# Patient Record
Sex: Female | Born: 1955 | Race: White | Hispanic: No | Smoking: Current every day smoker
Health system: Southern US, Community
[De-identification: ages and names within clinical notes are randomized; demographics above are authoritative.]

## PROBLEM LIST (undated history)

## (undated) DIAGNOSIS — I10 Essential (primary) hypertension: Secondary | ICD-10-CM

## (undated) DIAGNOSIS — K219 Gastro-esophageal reflux disease without esophagitis: Secondary | ICD-10-CM

## (undated) DIAGNOSIS — F32A Depression, unspecified: Secondary | ICD-10-CM

## (undated) DIAGNOSIS — F419 Anxiety disorder, unspecified: Secondary | ICD-10-CM

## (undated) DIAGNOSIS — N189 Chronic kidney disease, unspecified: Secondary | ICD-10-CM

## (undated) HISTORY — PX: APPENDECTOMY: SHX54

---

## 1999-07-16 ENCOUNTER — Encounter: Payer: Self-pay | Admitting: Family Medicine

## 1999-07-16 ENCOUNTER — Encounter: Admission: RE | Admit: 1999-07-16 | Discharge: 1999-07-16 | Payer: Self-pay | Admitting: Family Medicine

## 1999-12-31 ENCOUNTER — Other Ambulatory Visit: Admission: RE | Admit: 1999-12-31 | Discharge: 1999-12-31 | Payer: Self-pay | Admitting: *Deleted

## 2001-03-17 ENCOUNTER — Other Ambulatory Visit: Admission: RE | Admit: 2001-03-17 | Discharge: 2001-03-17 | Payer: Self-pay | Admitting: Family Medicine

## 2001-12-28 ENCOUNTER — Encounter: Admission: RE | Admit: 2001-12-28 | Discharge: 2001-12-28 | Payer: Self-pay | Admitting: Family Medicine

## 2001-12-28 ENCOUNTER — Encounter: Payer: Self-pay | Admitting: Family Medicine

## 2002-07-21 ENCOUNTER — Other Ambulatory Visit: Admission: RE | Admit: 2002-07-21 | Discharge: 2002-07-21 | Payer: Self-pay | Admitting: Family Medicine

## 2004-04-11 ENCOUNTER — Encounter: Admission: RE | Admit: 2004-04-11 | Discharge: 2004-04-11 | Payer: Self-pay | Admitting: Family Medicine

## 2004-05-22 ENCOUNTER — Other Ambulatory Visit: Admission: RE | Admit: 2004-05-22 | Discharge: 2004-05-22 | Payer: Self-pay | Admitting: Obstetrics and Gynecology

## 2004-05-31 ENCOUNTER — Encounter: Admission: RE | Admit: 2004-05-31 | Discharge: 2004-05-31 | Payer: Self-pay | Admitting: Obstetrics and Gynecology

## 2004-07-03 ENCOUNTER — Ambulatory Visit (HOSPITAL_COMMUNITY): Admission: RE | Admit: 2004-07-03 | Discharge: 2004-07-03 | Payer: Self-pay | Admitting: Obstetrics and Gynecology

## 2005-09-17 IMAGING — US US PELVIS COMPLETE MODIFY
1 series · 14 of 25 positions shown · non-contrast
Comparison: none

CLINICAL DATA: Irregular vaginal bleeding.
 ULTRASOUND OF THE PELVIS, COMPLETE, WITH TRANSVAGINAL, NON-OB
 Transabdominal and transvaginal imaging show overall normal uterine size and contour with measurements of 8.2 x 2.9 x 3.5 cm.  There are at least three fibroid lesions of the myometrium, all in the 2 cm size range.  In addition, however, there is an ovoid mass in or directly adjacent to the fundal aspect of the endometrium.  This measures 1.2 x 0.4 x 0.9 cm.  I cannot say with certainty if this is an endometrial polyp or a submucosal fibroid protruding into the endometrium.  However, it may well be the cause of the patient's vaginal bleeding.  A sonohysterogram would be of value, in that it could probably differentiate these two entities.  This can be done at the [HOSPITAL], if clinically warranted.
 Both ovaries are within normal limits in size and contour.  There are multiple follicles on the left. No free pelvic fluid. 
 IMPRESSION
 1.  Multiple fibroid lesions of the uterus.
 2.  Large endometrial polyp versus submucosal fibroid in the fundal region of the endometrium.  See report.

[Series 1: unknown · 0.23mm/px · 14 of 78 slices shown]
[im 1/78]
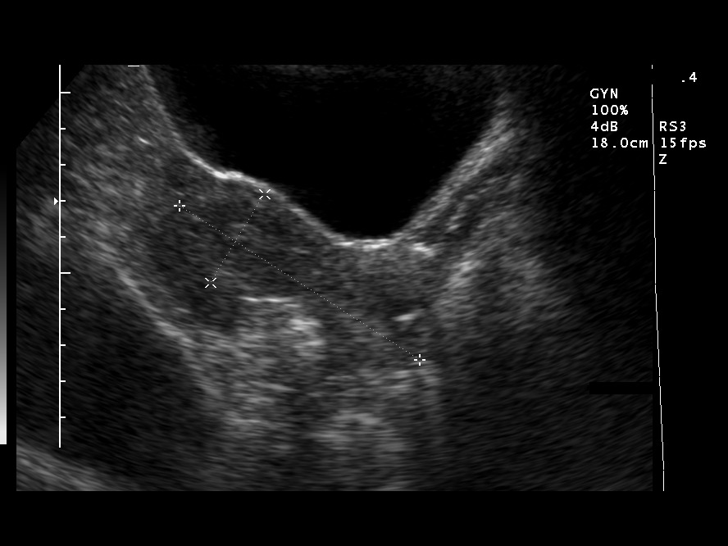
[im 7/78]
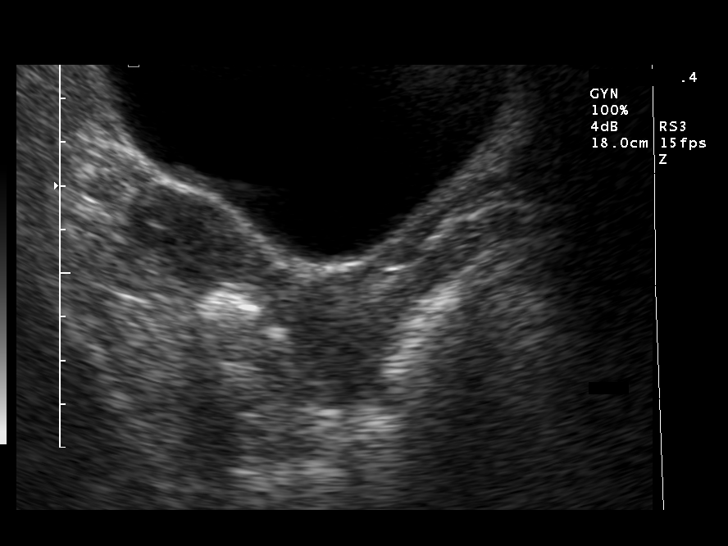
[im 13/78]
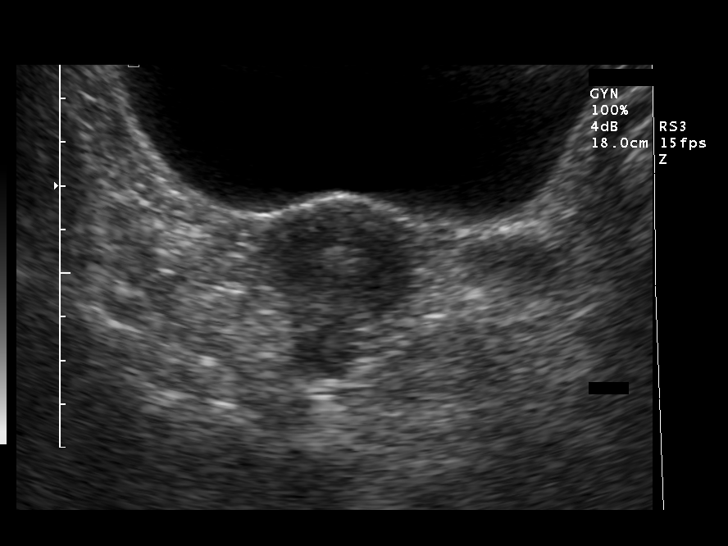
[im 20/78]
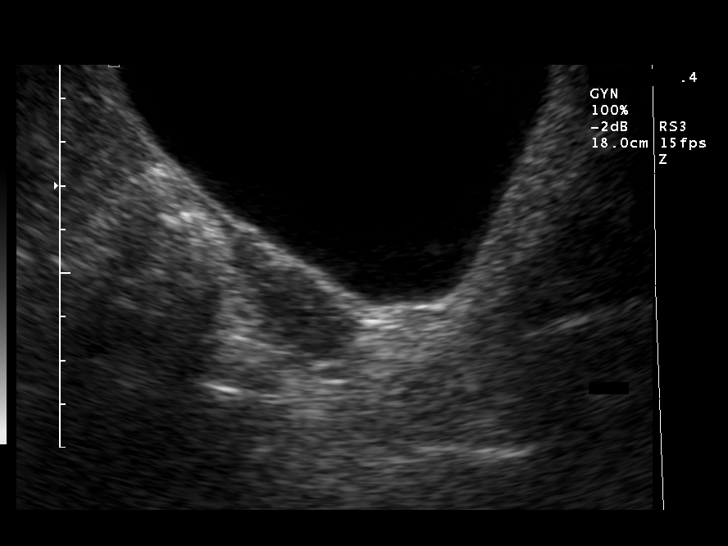
[im 26/78]
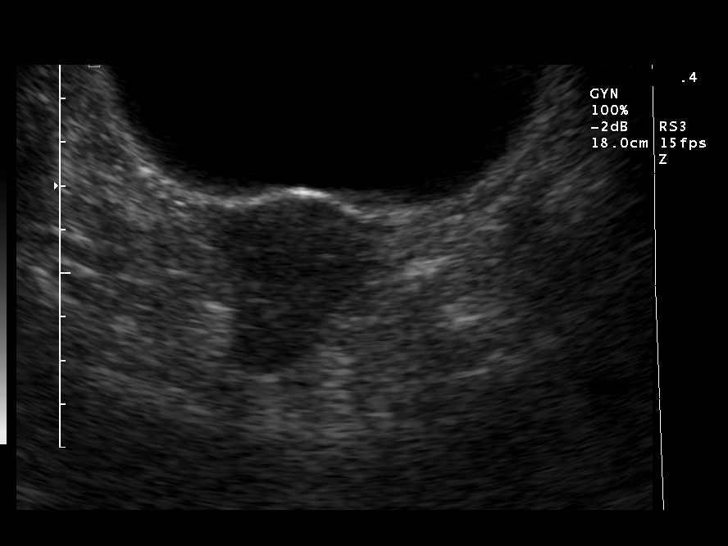
[im 29/78]
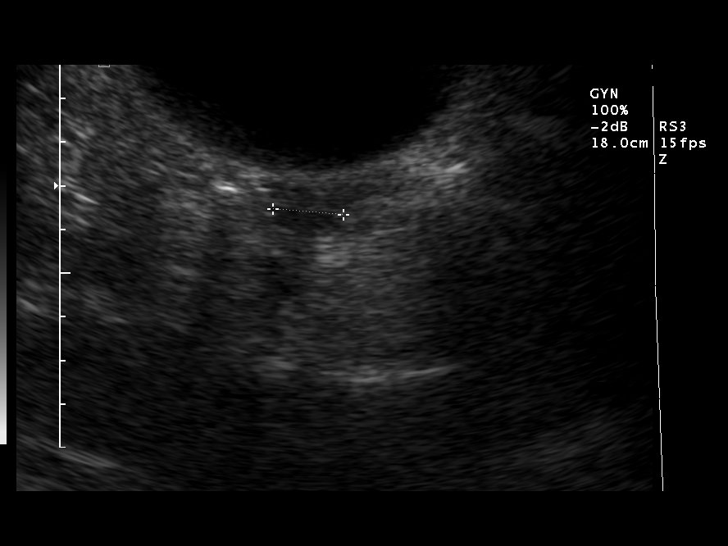
[im 36/78]
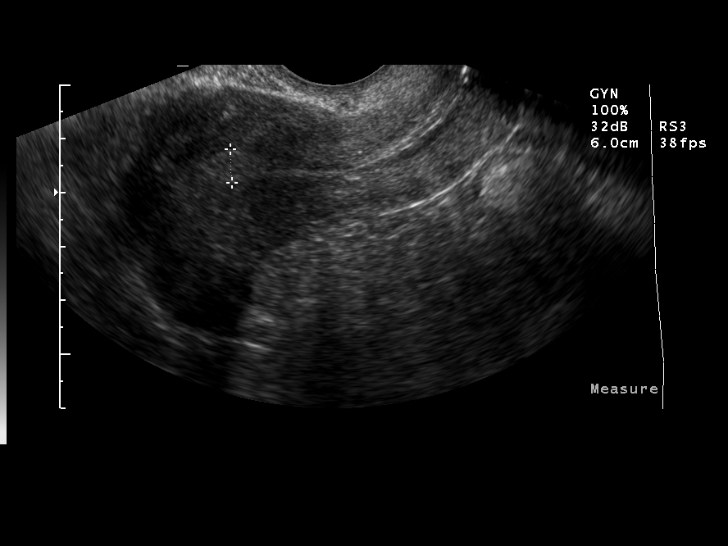
[im 42/78]
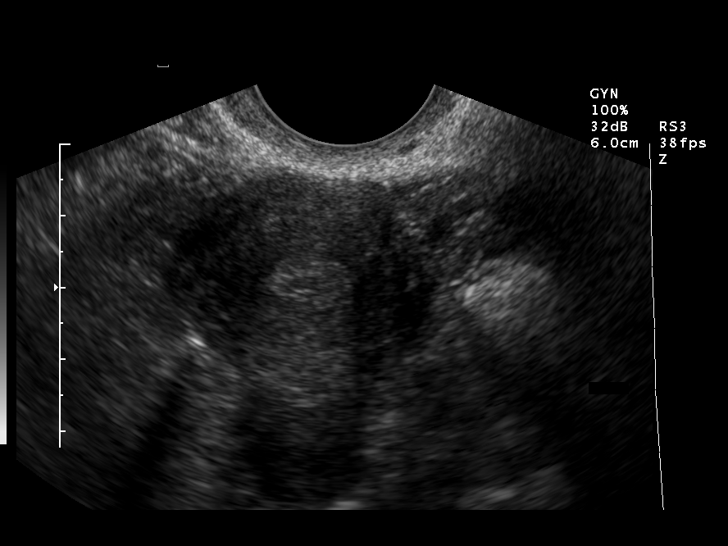
[im 49/78]
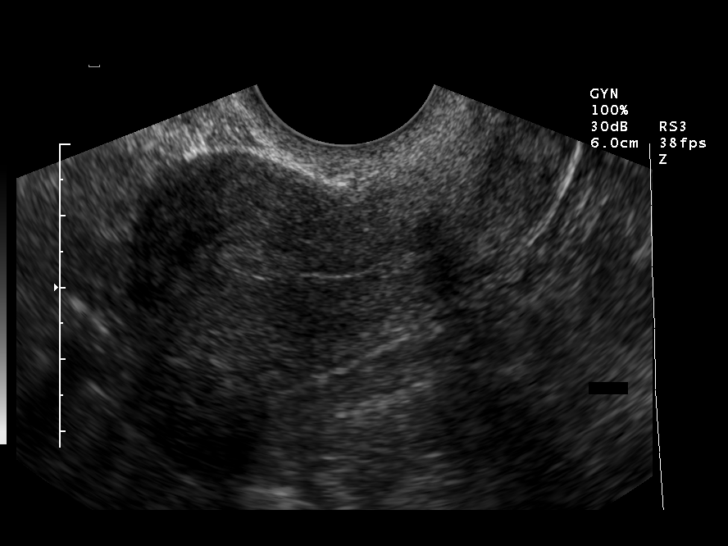
[im 52/78]
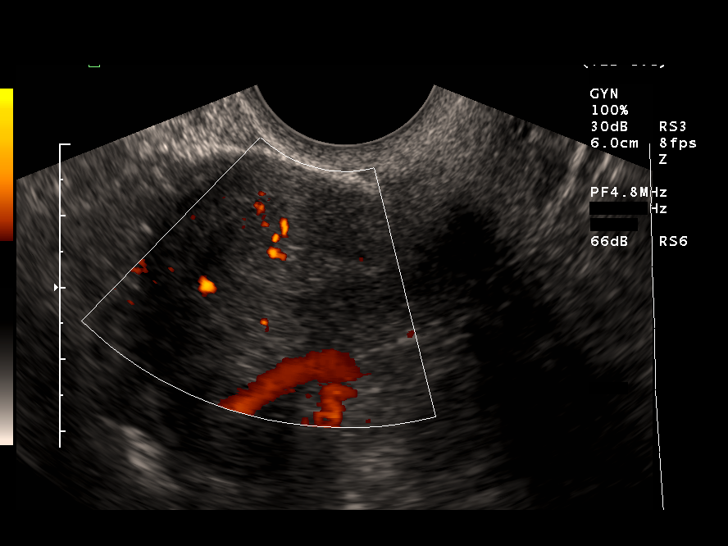
[im 58/78]
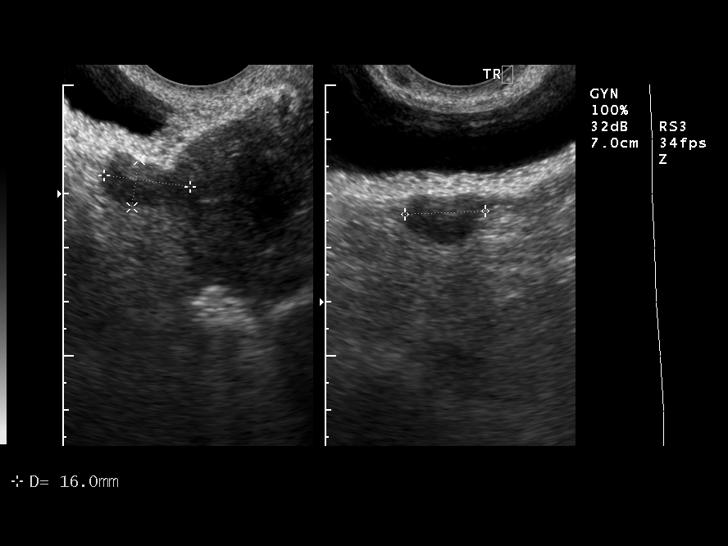
[im 65/78]
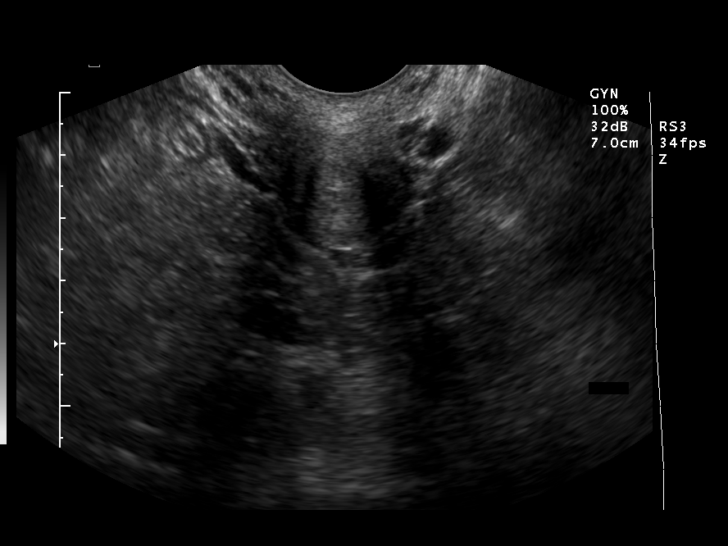
[im 71/78]
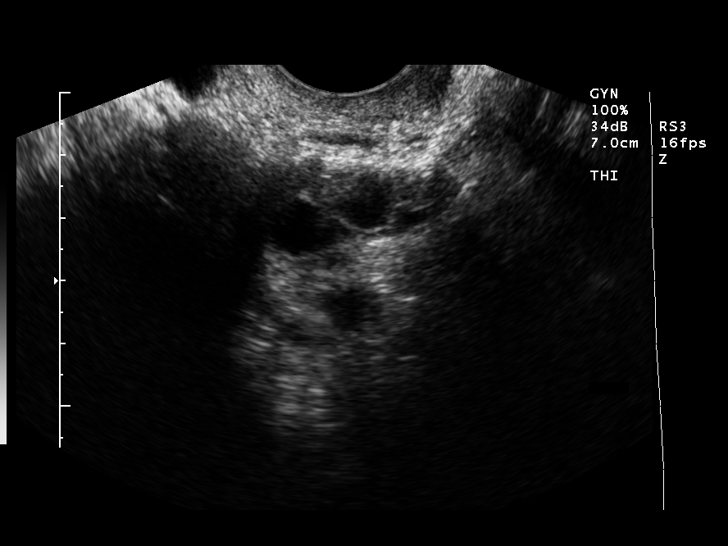
[im 78/78]
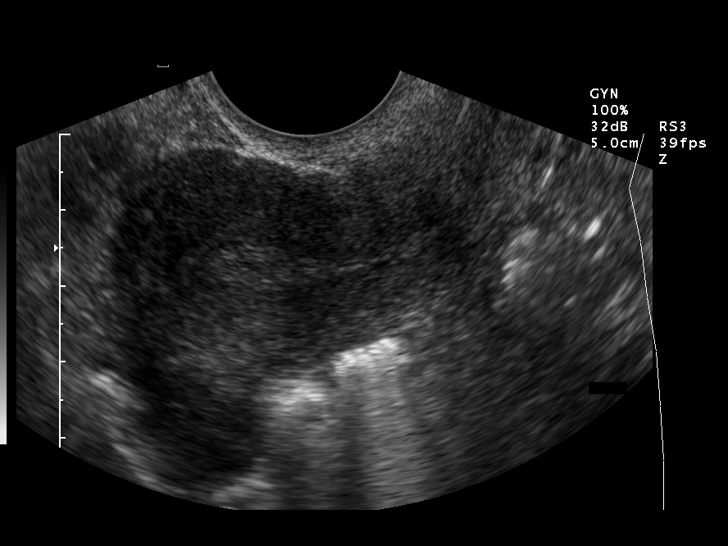

[14 of 25 positions shown; findings below may reference images not displayed]

## 2022-04-06 ENCOUNTER — Encounter (HOSPITAL_COMMUNITY): Payer: Self-pay | Admitting: Internal Medicine

## 2022-04-06 ENCOUNTER — Emergency Department (HOSPITAL_COMMUNITY): Payer: Medicare Other

## 2022-04-06 ENCOUNTER — Other Ambulatory Visit: Payer: Self-pay

## 2022-04-06 ENCOUNTER — Inpatient Hospital Stay (HOSPITAL_COMMUNITY)
Admission: EM | Admit: 2022-04-06 | Discharge: 2022-04-19 | DRG: 683 | Disposition: A | Discharge status: Home Health | Payer: Medicare Other | Attending: Student | Admitting: Student

## 2022-04-06 DIAGNOSIS — F1010 Alcohol abuse, uncomplicated: Secondary | ICD-10-CM | POA: Diagnosis present

## 2022-04-06 DIAGNOSIS — E876 Hypokalemia: Secondary | ICD-10-CM | POA: Diagnosis present

## 2022-04-06 DIAGNOSIS — N136 Pyonephrosis: Secondary | ICD-10-CM | POA: Diagnosis present

## 2022-04-06 DIAGNOSIS — F17210 Nicotine dependence, cigarettes, uncomplicated: Secondary | ICD-10-CM | POA: Diagnosis present

## 2022-04-06 DIAGNOSIS — Z9049 Acquired absence of other specified parts of digestive tract: Secondary | ICD-10-CM | POA: Diagnosis not present

## 2022-04-06 DIAGNOSIS — N3090 Cystitis, unspecified without hematuria: Secondary | ICD-10-CM | POA: Diagnosis not present

## 2022-04-06 DIAGNOSIS — N1330 Unspecified hydronephrosis: Secondary | ICD-10-CM | POA: Diagnosis not present

## 2022-04-06 DIAGNOSIS — I10 Essential (primary) hypertension: Secondary | ICD-10-CM | POA: Diagnosis present

## 2022-04-06 DIAGNOSIS — E8809 Other disorders of plasma-protein metabolism, not elsewhere classified: Secondary | ICD-10-CM | POA: Diagnosis present

## 2022-04-06 DIAGNOSIS — Z720 Tobacco use: Secondary | ICD-10-CM | POA: Diagnosis present

## 2022-04-06 DIAGNOSIS — R109 Unspecified abdominal pain: Principal | ICD-10-CM

## 2022-04-06 DIAGNOSIS — E872 Acidosis, unspecified: Secondary | ICD-10-CM | POA: Diagnosis present

## 2022-04-06 DIAGNOSIS — E871 Hypo-osmolality and hyponatremia: Secondary | ICD-10-CM | POA: Diagnosis present

## 2022-04-06 DIAGNOSIS — R000 Tachycardia, unspecified: Secondary | ICD-10-CM | POA: Diagnosis present

## 2022-04-06 DIAGNOSIS — Z79899 Other long term (current) drug therapy: Secondary | ICD-10-CM | POA: Diagnosis not present

## 2022-04-06 DIAGNOSIS — K567 Ileus, unspecified: Secondary | ICD-10-CM | POA: Diagnosis not present

## 2022-04-06 DIAGNOSIS — F419 Anxiety disorder, unspecified: Secondary | ICD-10-CM | POA: Diagnosis present

## 2022-04-06 DIAGNOSIS — N179 Acute kidney failure, unspecified: Principal | ICD-10-CM

## 2022-04-06 DIAGNOSIS — Z66 Do not resuscitate: Secondary | ICD-10-CM | POA: Diagnosis present

## 2022-04-06 DIAGNOSIS — R1030 Lower abdominal pain, unspecified: Secondary | ICD-10-CM | POA: Diagnosis not present

## 2022-04-06 DIAGNOSIS — D72825 Bandemia: Secondary | ICD-10-CM | POA: Diagnosis present

## 2022-04-06 DIAGNOSIS — Z79890 Hormone replacement therapy (postmenopausal): Secondary | ICD-10-CM

## 2022-04-06 DIAGNOSIS — K219 Gastro-esophageal reflux disease without esophagitis: Secondary | ICD-10-CM | POA: Diagnosis present

## 2022-04-06 DIAGNOSIS — Z8679 Personal history of other diseases of the circulatory system: Secondary | ICD-10-CM | POA: Insufficient documentation

## 2022-04-06 DIAGNOSIS — E860 Dehydration: Secondary | ICD-10-CM | POA: Diagnosis present

## 2022-04-06 HISTORY — DX: Gastro-esophageal reflux disease without esophagitis: K21.9

## 2022-04-06 HISTORY — DX: Essential (primary) hypertension: I10

## 2022-04-06 LAB — CBC WITH DIFFERENTIAL/PLATELET
Abs Immature Granulocytes: 0.11 10*3/uL — ABNORMAL HIGH (ref 0.00–0.07)
Basophils Absolute: 0.1 10*3/uL (ref 0.0–0.1)
Basophils Relative: 0 %
Eosinophils Absolute: 0 10*3/uL (ref 0.0–0.5)
Eosinophils Relative: 0 %
HCT: 37.2 % (ref 36.0–46.0)
Hemoglobin: 13 g/dL (ref 12.0–15.0)
Immature Granulocytes: 1 %
Lymphocytes Relative: 8 %
Lymphs Abs: 1.8 10*3/uL (ref 0.7–4.0)
MCH: 33.7 pg (ref 26.0–34.0)
MCHC: 34.9 g/dL (ref 30.0–36.0)
MCV: 96.4 fL (ref 80.0–100.0)
Monocytes Absolute: 1.1 10*3/uL — ABNORMAL HIGH (ref 0.1–1.0)
Monocytes Relative: 5 %
Neutro Abs: 19 10*3/uL — ABNORMAL HIGH (ref 1.7–7.7)
Neutrophils Relative %: 86 %
Platelets: 426 10*3/uL — ABNORMAL HIGH (ref 150–400)
RBC: 3.86 MIL/uL — ABNORMAL LOW (ref 3.87–5.11)
RDW: 14.1 % (ref 11.5–15.5)
WBC: 22 10*3/uL — ABNORMAL HIGH (ref 4.0–10.5)
nRBC: 0 % (ref 0.0–0.2)

## 2022-04-06 LAB — URINALYSIS, ROUTINE W REFLEX MICROSCOPIC
Bacteria, UA: NONE SEEN
Bilirubin Urine: NEGATIVE
Glucose, UA: NEGATIVE mg/dL
Ketones, ur: NEGATIVE mg/dL
Leukocytes,Ua: NEGATIVE
Nitrite: NEGATIVE
Protein, ur: NEGATIVE mg/dL
Specific Gravity, Urine: 1.009 (ref 1.005–1.030)
pH: 5 (ref 5.0–8.0)

## 2022-04-06 LAB — COMPREHENSIVE METABOLIC PANEL
ALT: 10 U/L (ref 0–44)
AST: 26 U/L (ref 15–41)
Albumin: 3.8 g/dL (ref 3.5–5.0)
Alkaline Phosphatase: 78 U/L (ref 38–126)
Anion gap: 14 (ref 5–15)
BUN: 25 mg/dL — ABNORMAL HIGH (ref 8–23)
CO2: 21 mmol/L — ABNORMAL LOW (ref 22–32)
Calcium: 9 mg/dL (ref 8.9–10.3)
Chloride: 97 mmol/L — ABNORMAL LOW (ref 98–111)
Creatinine, Ser: 2.39 mg/dL — ABNORMAL HIGH (ref 0.44–1.00)
GFR, Estimated: 22 mL/min — ABNORMAL LOW (ref >60)
Glucose, Bld: 100 mg/dL — ABNORMAL HIGH (ref 70–99)
Potassium: 3.6 mmol/L (ref 3.5–5.1)
Sodium: 132 mmol/L — ABNORMAL LOW (ref 135–145)
Total Bilirubin: 1.1 mg/dL (ref 0.3–1.2)
Total Protein: 7.5 g/dL (ref 6.5–8.1)

## 2022-04-06 LAB — LIPASE, BLOOD: Lipase: 24 U/L (ref 11–51)

## 2022-04-06 MED ORDER — SODIUM CHLORIDE 0.9 % IV BOLUS
1000.0000 mL | Freq: Once | INTRAVENOUS | Status: AC
  Administered 2022-04-06: 1000 mL via INTRAVENOUS

## 2022-04-06 MED ORDER — HYDROMORPHONE HCL 1 MG/ML IJ SOLN
0.5000 mg | INTRAMUSCULAR | Status: DC | PRN
  Administered 2022-04-06 – 2022-04-14 (×29): 1 mg via INTRAVENOUS
  Filled 2022-04-06 (×30): qty 1

## 2022-04-06 MED ORDER — PANTOPRAZOLE SODIUM 40 MG PO TBEC
40.0000 mg | DELAYED_RELEASE_TABLET | Freq: Every day | ORAL | Status: DC
  Administered 2022-04-07 – 2022-04-19 (×12): 40 mg via ORAL
  Filled 2022-04-06 (×12): qty 1

## 2022-04-06 MED ORDER — SODIUM CHLORIDE 0.9 % IV SOLN
1.0000 g | Freq: Once | INTRAVENOUS | Status: AC
  Administered 2022-04-06: 1 g via INTRAVENOUS
  Filled 2022-04-06: qty 10

## 2022-04-06 MED ORDER — DOCUSATE SODIUM 100 MG PO CAPS
100.0000 mg | ORAL_CAPSULE | Freq: Two times a day (BID) | ORAL | Status: DC
  Administered 2022-04-07 – 2022-04-17 (×18): 100 mg via ORAL
  Filled 2022-04-06 (×22): qty 1

## 2022-04-06 MED ORDER — THIAMINE HCL 100 MG PO TABS
100.0000 mg | ORAL_TABLET | Freq: Every day | ORAL | Status: DC
  Administered 2022-04-06 – 2022-04-19 (×12): 100 mg via ORAL
  Filled 2022-04-06 (×12): qty 1

## 2022-04-06 MED ORDER — LORAZEPAM 1 MG PO TABS
1.0000 mg | ORAL_TABLET | ORAL | Status: AC | PRN
  Administered 2022-04-08 (×4): 1 mg via ORAL
  Filled 2022-04-06 (×4): qty 1

## 2022-04-06 MED ORDER — ACETAMINOPHEN 650 MG RE SUPP: 650.0000 mg | Freq: Four times a day (QID) | RECTAL | Status: DC | PRN

## 2022-04-06 MED ORDER — MORPHINE SULFATE (PF) 4 MG/ML IV SOLN
4.0000 mg | Freq: Once | INTRAVENOUS | Status: AC
  Administered 2022-04-06: 4 mg via INTRAVENOUS
  Filled 2022-04-06: qty 1

## 2022-04-06 MED ORDER — SODIUM CHLORIDE 0.9 % IV SOLN
1.0000 g | INTRAVENOUS | Status: DC
  Administered 2022-04-07 – 2022-04-09 (×3): 1 g via INTRAVENOUS
  Filled 2022-04-06 (×3): qty 10

## 2022-04-06 MED ORDER — HEPARIN SODIUM (PORCINE) 5000 UNIT/ML IJ SOLN
5000.0000 [IU] | Freq: Three times a day (TID) | INTRAMUSCULAR | Status: DC
  Administered 2022-04-06 – 2022-04-11 (×15): 5000 [IU] via SUBCUTANEOUS
  Filled 2022-04-06 (×15): qty 1

## 2022-04-06 MED ORDER — ONDANSETRON HCL 4 MG/2ML IJ SOLN
4.0000 mg | Freq: Once | INTRAMUSCULAR | Status: AC
  Administered 2022-04-06: 4 mg via INTRAVENOUS
  Filled 2022-04-06: qty 2

## 2022-04-06 MED ORDER — ADULT MULTIVITAMIN W/MINERALS CH
1.0000 | ORAL_TABLET | Freq: Every day | ORAL | Status: DC
  Administered 2022-04-06 – 2022-04-19 (×7): 1 via ORAL
  Filled 2022-04-06 (×12): qty 1

## 2022-04-06 MED ORDER — THIAMINE HCL 100 MG/ML IJ SOLN
100.0000 mg | Freq: Every day | INTRAMUSCULAR | Status: DC
  Administered 2022-04-15 – 2022-04-16 (×2): 100 mg via INTRAVENOUS
  Filled 2022-04-06 (×4): qty 2

## 2022-04-06 MED ORDER — OXYCODONE HCL 5 MG PO TABS
5.0000 mg | ORAL_TABLET | ORAL | Status: DC | PRN
  Administered 2022-04-06 – 2022-04-17 (×35): 5 mg via ORAL
  Filled 2022-04-06 (×35): qty 1

## 2022-04-06 MED ORDER — ACETAMINOPHEN 325 MG PO TABS
650.0000 mg | ORAL_TABLET | Freq: Four times a day (QID) | ORAL | Status: DC | PRN
  Administered 2022-04-17 – 2022-04-19 (×3): 650 mg via ORAL
  Filled 2022-04-06 (×3): qty 2

## 2022-04-06 MED ORDER — NICOTINE 21 MG/24HR TD PT24
21.0000 mg | MEDICATED_PATCH | Freq: Every day | TRANSDERMAL | Status: DC
  Administered 2022-04-06 – 2022-04-18 (×12): 21 mg via TRANSDERMAL
  Filled 2022-04-06 (×14): qty 1

## 2022-04-06 MED ORDER — LORAZEPAM 2 MG/ML IJ SOLN
1.0000 mg | INTRAMUSCULAR | Status: AC | PRN
  Administered 2022-04-07 – 2022-04-08 (×2): 2 mg via INTRAVENOUS
  Filled 2022-04-06 (×2): qty 1

## 2022-04-06 MED ORDER — FOLIC ACID 1 MG PO TABS
1.0000 mg | ORAL_TABLET | Freq: Every day | ORAL | Status: DC
  Administered 2022-04-06 – 2022-04-19 (×13): 1 mg via ORAL
  Filled 2022-04-06 (×13): qty 1

## 2022-04-06 MED ORDER — FENTANYL CITRATE PF 50 MCG/ML IJ SOSY
50.0000 ug | PREFILLED_SYRINGE | Freq: Once | INTRAMUSCULAR | Status: AC
  Administered 2022-04-06: 50 ug via INTRAVENOUS
  Filled 2022-04-06: qty 1

## 2022-04-06 MED ORDER — SODIUM CHLORIDE (PF) 0.9 % IJ SOLN
INTRAMUSCULAR | Status: AC
  Filled 2022-04-06: qty 50

## 2022-04-06 MED ORDER — LACTATED RINGERS IV SOLN: INTRAVENOUS | Status: DC

## 2022-04-06 MED ORDER — IOHEXOL 300 MG/ML  SOLN
100.0000 mL | Freq: Once | INTRAMUSCULAR | Status: AC | PRN
  Administered 2022-04-06: 100 mL via INTRAVENOUS

## 2022-04-06 NOTE — H&P (Signed)
History and Physical    Patient: Betty [name]:[id number] DOB: May 16, 1956 DOA: 04/06/2022 DOS: the patient was seen and examined on 04/06/2022 PCP: Pcp, No  Patient coming from: Home  Chief Complaint:  Chief Complaint  Patient presents with   Flank Pain   HPI: Betty [name] is a 66 y.o. female with medical history significant of HTN, tobacco abuse, EtOH abuse, GERD. Presenting with right flank pain. She reports her symptoms began with incontinence 2 months ago. She had a few episodes and became concerned as this had never happened before. She went to Urgent Care. Nothing was found on w/u. She was sent home with abx for 7 days. Her symptoms did not improve. She reports constant bladder incontinence since. She also started having intermittent right flank pain that got worse over the last week. It's a sharp, stabbing pain that radiates into her stomach. It has caused her to have N/V. She denies fever, diarrhea at home. She's tried advil and APAP to relieve her symptoms, but they have not helped. When her symptoms did not improve this morning, she decided to come to the ED for evaluation. She denies any other aggravating or alleviating factors.   Review of Systems: As mentioned in the history of present illness. All other systems reviewed and are negative.  PMHx Hx of HTN GERD  PSHx Appendectomy  Social History: 2 ppd tobacco usage (50 year smoking Hx), 4 glasses gin & tonic daily (last drink 1 week ago), No illcit Rx   NKDA  Fam Hx Reviewed, noncontributory.  Prior to Admission medications   None    Physical Exam: Vitals:   04/06/22 0900 04/06/22 0959 04/06/22 1000 04/06/22 1003  BP: (!) 116/96  137/90   Pulse: 90  89   Resp: 20  16   Temp:  99 F (37.2 C)  99.7 F (37.6 C)  TempSrc:  Oral  Rectal  SpO2: 98%  94%    General: 66 y.o. female resting in bed in NAD Eyes: PERRL, normal sclera ENMT: Nares patent w/o discharge, orophaynx clear, dentition normal, ears w/o  discharge/lesions/ulcers Neck: Supple, trachea midline Cardiovascular: RRR, +S1, S2, no m/g/r, equal pulses throughout Respiratory: CTABL, no w/r/r, normal WOB GI: BS+, NDNT, no masses noted, no organomegaly noted MSK: No e/c/c Neuro: A&O x 3, no focal deficits Psyc: Appropriate interaction and affect, calm/cooperative  Data Reviewed:  Lab Results  Component Value Date   NA 132 (L) 04/06/2022   K 3.6 04/06/2022   CO2 21 (L) 04/06/2022   GLUCOSE 100 (H) 04/06/2022   BUN 25 (H) 04/06/2022   CREATININE 2.39 (H) 04/06/2022   CALCIUM 9.0 04/06/2022   GFRNONAA 22 (L) 04/06/2022   Lab Results  Component Value Date   WBC 22.0 (H) 04/06/2022   HGB 13.0 04/06/2022   HCT 37.2 04/06/2022   MCV 96.4 04/06/2022   PLT 426 (H) 04/06/2022   CT ab/pelvis 1. Mild bilateral hydroureteronephrosis. Both ureters are dilated down to the pelvis just proximal to the UVJ on each side where the ureteral urothelium may hyper enhance. This is associated with circumferential bladder wall thickening suggesting cystitis. 2. Marked collar of low-density material measuring 6 mm in thickness surrounding the proximal and mid right ureter. This probably reflects marked edema of the ureteral wall although contained periureteric edema or lymphoma cannot be excluded. Underlying transitional cell carcinoma is a consideration.  Assessment and Plan: Cystits B/l hydroureteronephrosis AKI     - admit to inpt, tele     -  continue rocephin, fluids     - follow urine cultures     - EDPA spoke with urology; recommended fluids, catheterization if not voiding; formal consult if not improving     - we don't have a baseline Scr as she hasn't regularly followed with a physician in over 10 years  Hyponatremia     - mild     - fluids, follow  Tobacco abuse     - counsel against further use     - nicotine patch  EtOH abuse     - hasn't had a drink in a week per her report     - will have CIWA ordered  GERD      - PPI  Advance Care Planning:   Code Status: DNR  Consults: EDPA spoke w/ urology  Family Communication: None at bedside  Severity of Illness: The appropriate patient status for this patient is INPATIENT. Inpatient status is judged to be reasonable and necessary in order to provide the required intensity of service to ensure the patient's safety. The patient's presenting symptoms, physical exam findings, and initial radiographic and laboratory data in the context of their chronic comorbidities is felt to place them at high risk for further clinical deterioration. Furthermore, it is not anticipated that the patient will be medically stable for discharge from the hospital within 2 midnights of admission.   * I certify that at the point of admission it is my clinical judgment that the patient will require inpatient hospital care spanning beyond 2 midnights from the point of admission due to high intensity of service, high risk for further deterioration and high frequency of surveillance required.*  Author: Jonnie Finner, DO 04/06/2022 10:04 AM  For on call review [email or web address].

## 2022-04-06 NOTE — ED Triage Notes (Signed)
Pt. BIB [facility] from home c/o R. Flank pain x1 month that has gotten worse over the past 3 days. Pain has spread all over her body. VS stable with EMS.

## 2022-04-06 NOTE — ED Provider Notes (Signed)
[facility] DEPT Provider Note   CSN: [id number] Arrival date & time: 04/06/22  0533     History  Chief Complaint  Patient presents with   Flank Pain    Betty [name] is a 66 y.o. female. With no past medical history presents to the emergency department for flank pain.  States symptoms have been ongoing for 2 months.  She describes having gradually worsening right-sided abdominal and back pain.  She states that particularly over the past 2 weeks it is significantly worsened and over the past 3 days she has had severe, constant right sided abdominal and back pain that is spreading to her right leg and right arm.  States that she has been using ibuprofen almost constantly and is switched to Tylenol.  States that it is worse with laying down and deep breaths.  Describes not being able to walk without bending over.  She has associated nausea with a small amount of vomiting.  She has had decreased appetite and states that her abdomen feels bloated.  Endorses drinking 4 gin and tonics a day. Additionally notes urinary incontinence of the past two months, worse at night. Denies dysuria, frequency. She denies any fevers, diarrhea, productive cough, chest pain.  She has chronic cough from smoking but denies shortness of breath.  Last bowel movement was Wednesday.  She states that she fell about a month ago in the shower but states that she did not fall on her right side.  She has not seen a physician in 10 years. Does note she has hypertension that is untreated, as well as previous appendectomy in childhood.    Flank Pain       Home Medications Prior to Admission medications   Not on File      Allergies    Patient has no allergy information on record.    Review of Systems   Review of Systems  Constitutional:  Positive for appetite change. Negative for fever.  Gastrointestinal:  Positive for diarrhea, nausea and vomiting.  Genitourinary:  Positive for flank  pain. Negative for dysuria.  All other systems reviewed and are negative.   Physical Exam Updated Vital Signs BP (!) 124/101   Pulse 90   Temp 97.8 F (36.6 C) (Oral)   Resp 20   SpO2 96%  Physical Exam Vitals and nursing note reviewed.  Constitutional:      General: She is not in acute distress.    Appearance: She is normal weight. She is ill-appearing.  HENT:     Head: Normocephalic and atraumatic.     Mouth/Throat:     Mouth: Mucous membranes are moist.     Pharynx: Oropharynx is clear.  Eyes:     General: No scleral icterus.    Extraocular Movements: Extraocular movements intact.     Pupils: Pupils are equal, round, and reactive to light.  Cardiovascular:     Rate and Rhythm: Normal rate and regular rhythm.     Pulses: Normal pulses.     Heart sounds: No murmur heard. Pulmonary:     Effort: Pulmonary effort is normal. No respiratory distress.     Breath sounds: Normal breath sounds.  Abdominal:     General: Bowel sounds are normal. There is no distension.     Palpations: Abdomen is soft.     Tenderness: There is generalized abdominal tenderness. There is right CVA tenderness and guarding.  Musculoskeletal:        General: Normal range of motion.  Cervical back: Normal range of motion.  Skin:    General: Skin is warm and dry.     Capillary Refill: Capillary refill takes less than 2 seconds.     Coloration: Skin is not jaundiced.     Findings: No rash.  Neurological:     General: No focal deficit present.     Mental Status: She is alert and oriented to person, place, and time. Mental status is at baseline.  Psychiatric:        Mood and Affect: Mood normal.        Behavior: Behavior normal.        Thought Content: Thought content normal.        Judgment: Judgment normal.    ED Results / Procedures / Treatments   Labs (all labs ordered are listed, but only abnormal results are displayed) Labs Reviewed  CBC WITH DIFFERENTIAL/PLATELET - Abnormal; Notable  for the following components:      Result Value   WBC 22.0 (*)    RBC 3.86 (*)    Platelets 426 (*)    Neutro Abs 19.0 (*)    Monocytes Absolute 1.1 (*)    Abs Immature Granulocytes 0.11 (*)    All other components within normal limits  COMPREHENSIVE METABOLIC PANEL - Abnormal; Notable for the following components:   Sodium 132 (*)    Chloride 97 (*)    CO2 21 (*)    Glucose, Bld 100 (*)    BUN 25 (*)    Creatinine, Ser 2.39 (*)    GFR, Estimated 22 (*)    All other components within normal limits  URINALYSIS, ROUTINE W REFLEX MICROSCOPIC - Abnormal; Notable for the following components:   Color, Urine STRAW (*)    Hgb urine dipstick SMALL (*)    All other components within normal limits  URINE CULTURE  LIPASE, BLOOD    EKG None  Radiology CT Abdomen Pelvis W Contrast  Result Date: 04/06/2022 CLINICAL DATA:  Abdominal pain.  Flank pain. EXAM: CT ABDOMEN AND PELVIS WITH CONTRAST TECHNIQUE: Multidetector CT imaging of the abdomen and pelvis was performed using the standard protocol following bolus administration of intravenous contrast. RADIATION DOSE REDUCTION: This exam was performed according to the departmental dose-optimization program which includes automated exposure control, adjustment of the mA and/or kV according to patient size and/or use of iterative reconstruction technique. CONTRAST:  129m OMNIPAQUE IOHEXOL 300 MG/ML  SOLN COMPARISON:  None Available. FINDINGS: Lower chest: Emphysema. Hepatobiliary: No suspicious focal abnormality within the liver parenchyma. There is no evidence for gallstones, gallbladder wall thickening, or pericholecystic fluid. No intrahepatic or extrahepatic biliary dilation. Pancreas: No focal mass lesion. No dilatation of the main duct. No intraparenchymal cyst. No peripancreatic edema. Spleen: No splenomegaly. No focal mass lesion. Adrenals/Urinary Tract: No adrenal nodule or mass. Mild bilateral hydroureteronephrosis. There is a marked collar of  low-density material measuring 6 mm surrounding the proximal and mid right ureter. Distal ureters appear decompressed with prominent enhancement. There is circumferential bladder wall thickening. Stomach/Bowel: Stomach is unremarkable. No gastric wall thickening. No evidence of outlet obstruction. Duodenum is normally positioned as is the ligament of Treitz. No small bowel wall thickening. No small bowel dilatation. The terminal ileum is normal. The appendix is not well visualized, but there is no edema or inflammation in the region of the cecum. No gross colonic mass. No colonic wall thickening. Vascular/Lymphatic: There is moderate atherosclerotic calcification of the abdominal aorta without aneurysm. There is no gastrohepatic or hepatoduodenal  ligament lymphadenopathy. No retroperitoneal or mesenteric lymphadenopathy. No pelvic sidewall lymphadenopathy. Reproductive: Unremarkable. Other: No intraperitoneal free fluid. Musculoskeletal: No worrisome lytic or sclerotic osseous abnormality. IMPRESSION: 1. Mild bilateral hydroureteronephrosis. Both ureters are dilated down to the pelvis just proximal to the UVJ on each side where the ureteral urothelium may hyper enhance. This is associated with circumferential bladder wall thickening suggesting cystitis. 2. Marked collar of low-density material measuring 6 mm in thickness surrounding the proximal and mid right ureter. This probably reflects marked edema of the ureteral wall although contained periureteric edema or lymphoma cannot be excluded. Underlying transitional cell carcinoma is a consideration. 3. Aortic Atherosclerosis (ICD10-I70.0) and Emphysema (ICD10-J43.9). Electronically Signed   By: Misty Stanley M.D.   On: 04/06/2022 08:00   DG Chest 2 View  Result Date: 04/06/2022 CLINICAL DATA:  Right flank pain for 1 month EXAM: CHEST - 2 VIEW COMPARISON:  None Available. FINDINGS: Artifact from EKG leads. Normal heart size and mediastinal contours. No acute  infiltrate or edema. No effusion or pneumothorax. No acute osseous findings. IMPRESSION: No active cardiopulmonary disease. Electronically Signed   By: Jorje Guild M.D.   On: 04/06/2022 07:22    Procedures Procedures   Medications Ordered in ED Medications  lactated ringers infusion ( Intravenous New Bag/Given 04/06/22 1039)  fentaNYL (SUBLIMAZE) injection 50 mcg (50 mcg Intravenous Given 04/06/22 0657)  ondansetron (ZOFRAN) injection 4 mg (4 mg Intravenous Given 04/06/22 0657)  sodium chloride (PF) 0.9 % injection (  Given 04/06/22 1006)  iohexol (OMNIPAQUE) 300 MG/ML solution 100 mL (100 mLs Intravenous Contrast Given 04/06/22 0719)  sodium chloride 0.9 % bolus 1,000 mL (0 mLs Intravenous Stopped 04/06/22 1003)  cefTRIAXone (ROCEPHIN) 1 g in sodium chloride 0.9 % 100 mL IVPB (0 g Intravenous Stopped 04/06/22 1003)  morphine (PF) 4 MG/ML injection 4 mg (4 mg Intravenous Given 04/06/22 0856)    ED Course/ Medical Decision Making/ A&P                           Medical Decision Making Amount and/or Complexity of Data Reviewed Labs: ordered. Radiology: ordered.  Risk Prescription drug management. Decision regarding hospitalization.  This patient presents to the ED for concern of right-sided flank pain, this involves an extensive number of treatment options, and is a complaint that carries with it a high risk of complications and morbidity.  The differential diagnosis includes pyelonephritis, stone, obstructed stone, infected stone, UTI, acute hepatobiliary disease, pneumonia, musculoskeletal, etc.  Co morbidities that complicate the patient evaluation Unknown  Additional history obtained:  Additional history obtained from: None External records from outside source obtained and reviewed including: None available  Lab Results: I personally ordered, reviewed, and interpreted labs. Pertinent results include: CBC with leukocytosis to 22 with a left shift CMP with elevated creatinine to  2.39.  Unclear baseline as patient has had minimal contact with medical care.  We will treat as if it is acute for now Lipase 24, negative Urine with small amount of hemoglobin otherwise negative  Imaging Studies ordered:  I ordered imaging studies which included x-ray and CT.  I independently reviewed & interpreted imaging & am in agreement with radiology impression. Imaging shows: Chest x-ray negative for pneumonia CT with multiple findings including bilateral hydroureteronephrosis.  There is also circumferential wall thickening of the bladder.  There is a Cholera low-density material measuring 6 mm in thickness surrounding the proximal and mid right ureter that reflects either edema of the  ureteral wall, periureteric edema or lymphoma  Medications  I ordered medication including fentanyl and morphine for pain, zofran for nausea, fluids for AKI ?infection, Rocephin for GU coverage Reevaluation of the patient after medication shows that patient improved -I reviewed the patient's home medications and did not make adjustments. -I did not prescribe new home medications.  Tests Considered: No further testing at this time   Critical Interventions: Antibiotics  Consultations: I requested consultation with the urologist, Dr. Garen Lah,  and discussed lab and imaging findings as well as pertinent plan - they recommend: fluid resusc, ensure bladder emptying. If retaining should catheter and will follow   SDH None identified   ED Course:  65 year old female who presents to the emergency department with predominantly right-sided abdominal and flank pain.  She is somewhat ill-appearing and uncomfortable on exam.  She has some guarding of her abdomen and pain with movement.  Concerns for possible peritoneal findings.  Her abdomen is soft and not rigid.  Her symptoms have been ongoing for at least 2 weeks now.  After exam concern for pyelonephritis versus acute hepatobiliary disease.  Initial labs  with significant leukocytosis to 22 with a left shift.  She has low-grade temp of 99.7.  She is not tachycardic, hypotensive and does not appear to be overtly septic.  CMP with a creatinine of 2.39.  Unclear baseline because she does not have any medical contact over the past decade.  I will go ahead and treat this as an acute kidney injury, although she does have hypertension and may have some chronic kidney disease underlying this.  I started her on fluids.  Lipase is negative, no pancreatitis. I obtained a chest x-ray given that she is having some shortness of breath or worsening pain with deep breaths.  There is no lower lobe pneumonia. Obtained a CT abdomen pelvis given the severity of her symptoms.  Also obtaining a urine. CT with concern for likely cystitis, she also has bilateral hydroureteronephrosis and a enhancing masslike area around the right ureter.  Unclear what exactly this is.  Pending her UA but empirically starting her on Rocephin.  I also consulted urology.  UA with no infection.  Somewhat unexpected finding.  I spoke with Dr. Garen Lah, urology, and he is somewhat surprised about clean UA.  Okay with Rocephin.  He also recommends bladder scan to ensure no retention.  If she is retaining, will place urinary catheter, but otherwise admitting for AKI.  Bladder scan 0   Consulted and spoke with Dr. Marylyn Ishihara, hospitalist who agrees to admit the patient for ongoing work-up of leukocytosis, GU process.  Her constellation of symptoms is inconsistent with other etiologies of abdominal pain such as acute hepatobiliary disease.  She does not have a transaminitis or elevated bilirubin.  There are no renal or biliary stones on her CT exam.  There is no evidence of a gastroenteritis, diverticulitis, viscus perforation, bowel obstruction, pancreatitis, appendicitis.  Ovaries and uterus look normal.  At this time most likely diagnosis is ascending urinary infection versus some sort of obstructive neoplastic  process.  She is admitted in agreeable to ongoing management.  After consideration of the diagnostic results and the patients response to treatment, I feel that the patent would benefit from admission. The patient has been appropriately medically screened and/or stabilized in the ED.    Final Clinical Impression(s) / ED Diagnoses Final diagnoses:  Right flank pain    Rx / DC Orders ED Discharge Orders     None  Mickie Hillier, PA-C 04/06/22 1120    Daleen Bo, MD 04/06/22 [phone]

## 2022-04-06 NOTE — ED Notes (Signed)
Pt unable to provide sufficient amount of urine for collection. Pt will contact staff for assistance when able to provide sample at later time. [employer]

## 2022-04-07 DIAGNOSIS — E876 Hypokalemia: Secondary | ICD-10-CM | POA: Diagnosis present

## 2022-04-07 DIAGNOSIS — N179 Acute kidney failure, unspecified: Secondary | ICD-10-CM | POA: Diagnosis not present

## 2022-04-07 LAB — COMPREHENSIVE METABOLIC PANEL
ALT: 9 U/L (ref 0–44)
AST: 14 U/L — ABNORMAL LOW (ref 15–41)
Albumin: 3.7 g/dL (ref 3.5–5.0)
Alkaline Phosphatase: 75 U/L (ref 38–126)
Anion gap: 14 (ref 5–15)
BUN: 23 mg/dL (ref 8–23)
CO2: 19 mmol/L — ABNORMAL LOW (ref 22–32)
Calcium: 9.3 mg/dL (ref 8.9–10.3)
Chloride: 105 mmol/L (ref 98–111)
Creatinine, Ser: 2.32 mg/dL — ABNORMAL HIGH (ref 0.44–1.00)
GFR, Estimated: 23 mL/min — ABNORMAL LOW (ref >60)
Glucose, Bld: 76 mg/dL (ref 70–99)
Potassium: 2.9 mmol/L — ABNORMAL LOW (ref 3.5–5.1)
Sodium: 138 mmol/L (ref 135–145)
Total Bilirubin: 0.4 mg/dL (ref 0.3–1.2)
Total Protein: 7.2 g/dL (ref 6.5–8.1)

## 2022-04-07 LAB — CBC
HCT: 37.1 % (ref 36.0–46.0)
Hemoglobin: 12.4 g/dL (ref 12.0–15.0)
MCH: 34 pg (ref 26.0–34.0)
MCHC: 33.4 g/dL (ref 30.0–36.0)
MCV: 101.6 fL — ABNORMAL HIGH (ref 80.0–100.0)
Platelets: 408 10*3/uL — ABNORMAL HIGH (ref 150–400)
RBC: 3.65 MIL/uL — ABNORMAL LOW (ref 3.87–5.11)
RDW: 14.2 % (ref 11.5–15.5)
WBC: 13.4 10*3/uL — ABNORMAL HIGH (ref 4.0–10.5)
nRBC: 0 % (ref 0.0–0.2)

## 2022-04-07 LAB — BASIC METABOLIC PANEL
Anion gap: 10 (ref 5–15)
BUN: 23 mg/dL (ref 8–23)
CO2: 21 mmol/L — ABNORMAL LOW (ref 22–32)
Calcium: 8.9 mg/dL (ref 8.9–10.3)
Chloride: 106 mmol/L (ref 98–111)
Creatinine, Ser: 2.25 mg/dL — ABNORMAL HIGH (ref 0.44–1.00)
GFR, Estimated: 24 mL/min — ABNORMAL LOW (ref >60)
Glucose, Bld: 77 mg/dL (ref 70–99)
Potassium: 3.8 mmol/L (ref 3.5–5.1)
Sodium: 137 mmol/L (ref 135–145)

## 2022-04-07 LAB — URINE CULTURE: Culture: <10000 — AB

## 2022-04-07 LAB — HIV ANTIBODY (ROUTINE TESTING W REFLEX): HIV Screen 4th Generation wRfx: NONREACTIVE

## 2022-04-07 MED ORDER — POTASSIUM CHLORIDE 10 MEQ/100ML IV SOLN
10.0000 meq | INTRAVENOUS | Status: AC
  Administered 2022-04-07 (×4): 10 meq via INTRAVENOUS
  Filled 2022-04-07 (×4): qty 100

## 2022-04-07 NOTE — Assessment & Plan Note (Addendum)
Creatinine is minimally improved from 2.39 on admission to 2.32 today. Continue IV fluids. Avoid nephrotoxins and hypotension. Monitor creatinine and electrolytes. Due in part to functional obstruction of ureters by severe cystitis. Continue IV antibiotics. Urology is aware. Upon admission they did not feel there was a role for urological services at that point. However, patient does not seem to be improving. Unless she is better tomorrow morning, they need to be consulted.

## 2022-04-07 NOTE — Assessment & Plan Note (Signed)
Noted. Nicotine patch has been made available.

## 2022-04-07 NOTE — Assessment & Plan Note (Addendum)
CT report suggests that this is due to severe inflammation related to cystitis, although transitional cell malignancy is a possibility as well. Monitor. Consult urology tomorrow if not much improved.

## 2022-04-07 NOTE — Assessment & Plan Note (Signed)
Noted. The patient is on a CIWA protocol.

## 2022-04-07 NOTE — Evaluation (Signed)
Clinical/Bedside Swallow Evaluation Patient Details  Name: Betty [name] MRN: [id number] Date of Birth: [birth year]  Today's Date: 04/07/2022 Time: SLP Start Time (ACUTE ONLY): 32 SLP Stop Time (ACUTE ONLY): 1320 SLP Time Calculation (min) (ACUTE ONLY): 12 min  Past Medical History:  Past Medical History:  Diagnosis Date   GERD (gastroesophageal reflux disease)    HTN (hypertension)    Past Surgical History: History reviewed. No pertinent surgical history. HPI:  Betty [name] is a 66 y.o. female with medical history significant of HTN, tobacco abuse, EtOH abuse, GERD. Presenting with right flank pain.  CXR negative for acute abnormalities.    Assessment / Plan / Recommendation  Clinical Impression   Pt was seen for a bedside swallow evaluation in the setting of self-reported dysphagia and appears to have functional oropharyngeal swallowing abilities. Pt stated that she exhibits globus sensation when taking medications and that it has been ongoing since she was a child, but that she does not have difficulty with solids or liquids.  She additionally reported a hx of reflux.  Suggested taking medications whole in puree and she politely declined stating that the globus sensation "wasn't too bad".  Oral motor examination was unremarkable.  Pt consumed trials of thin liquid, puree, and regular solids.  Mastication was timely and AP transit and swallow initiation appeared timely.  No overt s/sx of aspiration were observed with any PO trials.  Recommend diet upgrade to regular solids and thin liquids with medication administered whole in thin liquid (one at a time) per pt preference.  No additional skilled ST is warranted at this time.  Please re-consult if additional needs arise.  SLP Visit Diagnosis: Dysphagia, unspecified (R13.10)    Aspiration Risk  No limitations    Diet Recommendation Regular;Thin liquid   Liquid Administration via: Cup;Straw Medication Administration: Whole meds with  liquid Supervision: Patient able to self feed Compensations: Slow rate;Small sips/bites Postural Changes: Seated upright at 90 degrees    Other  Recommendations Oral Care Recommendations: Oral care BID    Recommendations for follow up therapy are one component of a multi-disciplinary discharge planning process, led by the attending physician.  Recommendations may be updated based on patient status, additional functional criteria and insurance authorization.  Follow up Recommendations No SLP follow up      Assistance Recommended at Discharge    Functional Status Assessment Patient has had a recent decline in their functional status and demonstrates the ability to make significant improvements in function in a reasonable and predictable amount of time.  Frequency and Duration          Swallow Study   General HPI: Betty [name] is a 66 y.o. female with medical history significant of HTN, tobacco abuse, EtOH abuse, GERD. Presenting with right flank pain.  CXR negative for acute abnormalities. Type of Study: Bedside Swallow Evaluation Previous Swallow Assessment: N/A Diet Prior to this Study: Thin liquids (clear liquids) Temperature Spikes Noted: Yes Respiratory Status: Room air History of Recent Intubation: No Behavior/Cognition: Alert;Cooperative;Pleasant mood Oral Cavity Assessment: Within Functional Limits Oral Care Completed by SLP: No Oral Cavity - Dentition: Adequate natural dentition Vision: Functional for self-feeding Self-Feeding Abilities: Able to feed self;Needs set up Patient Positioning: Upright in chair Baseline Vocal Quality: Normal Volitional Cough: Strong Volitional Swallow: Able to elicit    Oral/Motor/Sensory Function Overall Oral Motor/Sensory Function: Within functional limits   Ice Chips Ice chips: Not tested   Thin Liquid Thin Liquid: Within functional limits    Nectar  Thick Nectar Thick Liquid: Not tested   Honey Thick Honey Thick Liquid: Not tested    Puree Puree: Within functional limits Presentation: Self Fed;Spoon   Solid     Solid: Within functional limits Presentation: [place], M.S., [place] Office: [phone]  Elvia Collum Rahul Malinak 04/07/2022,1:30 PM

## 2022-04-07 NOTE — Progress Notes (Signed)
PROGRESS NOTE  Betty [name] DOB: [birth year] DOA: 04/06/2022 PCP: Pcp, No  Brief History   Betty [name] is a 66 y.o. female with medical history significant of HTN, tobacco abuse, EtOH abuse, GERD. Presenting with right flank pain. She reports her symptoms began with incontinence 2 months ago. She had a few episodes and became concerned as this had never happened before. She went to Urgent Care. Nothing was found on w/u. She was sent home with abx for 7 days. Her symptoms did not improve. She reports constant bladder incontinence since. She also started having intermittent right flank pain that got worse over Betty last week. It's a sharp, stabbing pain that radiates into her stomach. It has caused her to have N/V. She denies fever, diarrhea at home. She's tried advil and APAP to relieve her symptoms, but they have not helped. When her symptoms did not improve this morning, she decided to come to [facility] ED for evaluation. She denies any other aggravating or alleviating factors.   In Betty ED CT abdomen and pelvis demonstrated:  1. Mild bilateral hydroureteronephrosis. Both ureters are dilated down to Betty pelvis just proximal to Betty UVJ on each side where Betty ureteral urothelium may hyper enhance. This is associated with circumferential bladder wall thickening suggesting cystitis. 2. Marked collar of low-density material measuring 6 mm in thickness surrounding Betty proximal and mid right ureter. This probably reflects marked edema of Betty ureteral wall although contained periureteric edema or lymphoma cannot be excluded. Underlying transitional cell carcinoma is a consideration Urology was contacted by EDP. Recommendation is to reduce inflammation with IV antibiotics, and call urology if symptoms do not improve over Betty next day.  [facility] were consulted to admit Betty [name] for further evaluation and treatment. Betty [name] was admitted to a telemetry bed on a CIWA protocol. She  is receiving IV Rocephin, pain control, and antiemetics. She is also receiving IV fluids.   Monitor creatinine, electrolytes, and CBC.   Consultants  None  Procedures  None  Antibiotics   Anti-infectives (From admission, onward)    Start     Dose/Rate Route Frequency Ordered Stop   04/07/22 0800  cefTRIAXone (ROCEPHIN) 1 g in sodium chloride 0.9 % 100 mL IVPB        1 g 200 mL/hr over 30 Minutes Intravenous Every 24 hours 04/06/22 1422     04/06/22 0845  cefTRIAXone (ROCEPHIN) 1 g in sodium chloride 0.9 % 100 mL IVPB        1 g 200 mL/hr over 30 Minutes Intravenous  Once 04/06/22 4854 04/06/22 1003      Subjective  Betty [name] is complaining of severe pain, but she has not asked nursing for pain medication.   Objective   Vitals:  Vitals:   04/07/22 0614 04/07/22 1412  BP: 135/74 (!) 160/90  Pulse: 86 87  Resp: 18 20  Temp: 97.8 F (36.6 C) 98 F (36.7 C)  SpO2: 90% 94%    Exam:  Constitutional:  Betty [name] is awake, alert, and oriented x 3. She is in moderate distress. Respiratory:  No increased work of breathing. No wheezes, rales, or rhonchi No tactile fremitus Cardiovascular:  Regular rate and rhythm No murmurs, ectopy, or gallups. No lateral PMI. No thrills. Abdomen:  Abdomen is soft, non-tender, non-distended No hernias, masses, or organomegaly Normoactive bowel sounds.  Musculoskeletal:  No cyanosis, clubbing, or edema Skin:  No rashes, lesions, ulcers palpation of skin: no induration or nodules Neurologic:  CN 2-12 intact Sensation all 4 extremities intact Psychiatric:  Mental status Mood, affect appropriate Orientation to person, place, time  judgment and insight appear intact   I have personally reviewed Betty following:   Today's Data  Vitals  Lab Data  CBC BMP  Micro Data  Urine culture has had insignificant growth.  Imaging  CTA abdoment and pelvis  Cardiology Data    Other Data    Scheduled Meds:  docusate  sodium  100 mg Oral BID   folic acid  1 mg Oral Daily   heparin  5,000 Units Subcutaneous Q8H   multivitamin with minerals  1 tablet Oral Daily   nicotine  21 mg Transdermal Daily   pantoprazole  40 mg Oral Daily   thiamine  100 mg Oral Daily   Or   thiamine  100 mg Intravenous Daily   Continuous Infusions:  cefTRIAXone (ROCEPHIN)  IV 1 g (04/07/22 0857)   lactated ringers 125 mL/hr at 04/07/22 0444    Principal Problem:   AKI (acute kidney injury) ([place]) Active Problems:   Cystitis   Hydroureteronephrosis   Tobacco abuse   Alcohol abuse   Hyponatremia   Hypokalemia   LOS: 1 day   A & P  Assessment and Plan: * AKI (acute kidney injury) ([place]) Creatinine is minimally improved from 2.39 on admission to 2.32 today. Continue IV fluids. Avoid nephrotoxins and hypotension. Monitor creatinine and electrolytes. Due in part to functional obstruction of ureters by severe cystitis. Continue IV antibiotics. Urology is aware. They do not feel there is a role for urological services at this point. They recommending calling him if Betty [name] isn't improved in a couple of days.  Hypokalemia Potassium was 2.9 this am. Supplement and monitor.  Hyponatremia Sodium is improved from 132 to 138. Monitor.  Alcohol abuse Noted. Betty [name] is on a CIWA protocol.  Tobacco abuse Noted. Nicotine patch has been made available.  Hydroureteronephrosis CT report suggests that this is due to severe inflammation related to cystitis. Monitor. Consult urology tomorrow if not improved.   Cystitis Severe. Radiology suggests that inflammation of Betty bladder is causing swelling of Betty ureters and a functional obstruction. Continue antibiotics. Will call urology tomorrow if not much improved.   I have seen and examined this [name] myself. I have spent 36 minutes in her evaluation and treatment.  DVT prophylaxis: Heparin Code Status: DNR Family Communication: None available Disposition Plan: Home     Betty Weinheimer, DO [facility] Direct contact: see [email or web address]  7PM-7AM contact night coverage as above 04/07/2022, 5:55 PM  LOS: 1 day

## 2022-04-07 NOTE — Assessment & Plan Note (Addendum)
Sodium is improved from 132 to 137. Monitor.

## 2022-04-07 NOTE — Assessment & Plan Note (Addendum)
Resolved.  Continue to monitor.

## 2022-04-07 NOTE — Assessment & Plan Note (Addendum)
Severe. Radiology suggests that inflammation of the bladder is causing swelling of the ureters and a functional obstruction. Continue antibiotics. Urology to be consulted tomorrow if not much improved.

## 2022-04-08 DIAGNOSIS — N179 Acute kidney failure, unspecified: Secondary | ICD-10-CM | POA: Diagnosis not present

## 2022-04-08 LAB — CBC WITH DIFFERENTIAL/PLATELET
Abs Immature Granulocytes: 0.07 10*3/uL (ref 0.00–0.07)
Basophils Absolute: 0.1 10*3/uL (ref 0.0–0.1)
Basophils Relative: 1 %
Eosinophils Absolute: 0.1 10*3/uL (ref 0.0–0.5)
Eosinophils Relative: 1 %
HCT: 31.4 % — ABNORMAL LOW (ref 36.0–46.0)
Hemoglobin: 10.5 g/dL — ABNORMAL LOW (ref 12.0–15.0)
Immature Granulocytes: 1 %
Lymphocytes Relative: 9 %
Lymphs Abs: 1.2 10*3/uL (ref 0.7–4.0)
MCH: 33.3 pg (ref 26.0–34.0)
MCHC: 33.4 g/dL (ref 30.0–36.0)
MCV: 99.7 fL (ref 80.0–100.0)
Monocytes Absolute: 1 10*3/uL (ref 0.1–1.0)
Monocytes Relative: 7 %
Neutro Abs: 10.7 10*3/uL — ABNORMAL HIGH (ref 1.7–7.7)
Neutrophils Relative %: 81 %
Platelets: 408 10*3/uL — ABNORMAL HIGH (ref 150–400)
RBC: 3.15 MIL/uL — ABNORMAL LOW (ref 3.87–5.11)
RDW: 14.2 % (ref 11.5–15.5)
WBC: 13.2 10*3/uL — ABNORMAL HIGH (ref 4.0–10.5)
nRBC: 0 % (ref 0.0–0.2)

## 2022-04-08 LAB — BASIC METABOLIC PANEL
Anion gap: 11 (ref 5–15)
BUN: 21 mg/dL (ref 8–23)
CO2: 19 mmol/L — ABNORMAL LOW (ref 22–32)
Calcium: 8.8 mg/dL — ABNORMAL LOW (ref 8.9–10.3)
Chloride: 107 mmol/L (ref 98–111)
Creatinine, Ser: 2.3 mg/dL — ABNORMAL HIGH (ref 0.44–1.00)
GFR, Estimated: 23 mL/min — ABNORMAL LOW (ref >60)
Glucose, Bld: 74 mg/dL (ref 70–99)
Potassium: 3.5 mmol/L (ref 3.5–5.1)
Sodium: 137 mmol/L (ref 135–145)

## 2022-04-08 LAB — MAGNESIUM: Magnesium: 1.4 mg/dL — ABNORMAL LOW (ref 1.7–2.4)

## 2022-04-08 MED ORDER — SPOT INK MARKER SYRINGE KIT
PACK | SUBMUCOSAL | Status: AC
  Filled 2022-04-08: qty 5

## 2022-04-08 MED ORDER — MAGNESIUM SULFATE 2 GM/50ML IV SOLN
2.0000 g | Freq: Once | INTRAVENOUS | Status: AC
  Administered 2022-04-08: 2 g via INTRAVENOUS
  Filled 2022-04-08: qty 50

## 2022-04-08 NOTE — Plan of Care (Signed)
Pt rating pain 9/10.  PRNs administered per MAR.  Continues to be restless and anxious.   Problem: Pain Managment: Goal: General experience of comfort will improve Outcome: Not Progressing

## 2022-04-08 NOTE — Progress Notes (Signed)
PROGRESS NOTE  Betty [name] AUQ:[id number] DOB: 05/09/1956 DOA: 04/06/2022 PCP: Pcp, No  Brief History   Betty [name] is a 66 y.o. female with medical history significant of HTN, tobacco abuse, EtOH abuse, GERD. Presenting with right flank pain. She reports her symptoms began with incontinence 2 months ago. She had a few episodes and became concerned as this had never happened before. She went to Urgent Care. Nothing was found on w/u. She was sent home with abx for 7 days. Her symptoms did not improve. She reports constant bladder incontinence since. She also started having intermittent right flank pain that got worse over the last week. It's a sharp, stabbing pain that radiates into her stomach. It has caused her to have N/V. She denies fever, diarrhea at home. She's tried advil and APAP to relieve her symptoms, but they have not helped. When her symptoms did not improve this morning, she decided to come to the ED for evaluation. She denies any other aggravating or alleviating factors.   In the ED CT abdomen and pelvis demonstrated:  1. Mild bilateral hydroureteronephrosis. Both ureters are dilated down to the pelvis just proximal to the UVJ on each side where the ureteral urothelium may hyper enhance. This is associated with circumferential bladder wall thickening suggesting cystitis. 2. Marked collar of low-density material measuring 6 mm in thickness surrounding the proximal and mid right ureter. This probably reflects marked edema of the ureteral wall although contained periureteric edema or lymphoma cannot be excluded. Underlying transitional cell carcinoma is a consideration Urology was contacted by EDP. Recommendation is to reduce inflammation with IV antibiotics, and call urology if symptoms do not improve over the next day.  [facility] were consulted to admit the patient for further evaluation and treatment. The patient was admitted to a telemetry bed on a CIWA protocol. She  is receiving IV Rocephin, pain control, and antiemetics. She is also receiving IV fluids.   Monitor creatinine, electrolytes, and CBC.   Consultants  None  Procedures  None  Antibiotics   Anti-infectives (From admission, onward)    Start     Dose/Rate Route Frequency Ordered Stop   04/07/22 0800  cefTRIAXone (ROCEPHIN) 1 g in sodium chloride 0.9 % 100 mL IVPB        1 g 200 mL/hr over 30 Minutes Intravenous Every 24 hours 04/06/22 1422     04/06/22 0845  cefTRIAXone (ROCEPHIN) 1 g in sodium chloride 0.9 % 100 mL IVPB        1 g 200 mL/hr over 30 Minutes Intravenous  Once 04/06/22 6389 04/06/22 1003      Subjective  The patient is continues to complain of pain. She does not feel any better.  Objective   Vitals:  Vitals:   04/08/22 0605 04/08/22 1355  BP: (!) 150/79 (!) 176/93  Pulse: 81 94  Resp: 16 14  Temp: 98 F (36.7 C) 98 F (36.7 C)  SpO2: 93% 93%    Exam:  Constitutional:  The patient is awake, alert, and oriented x 3. She is in moderate distress. Respiratory:  No increased work of breathing. No wheezes, rales, or rhonchi No tactile fremitus Cardiovascular:  Regular rate and rhythm No murmurs, ectopy, or gallups. No lateral PMI. No thrills. Abdomen:  Abdomen is soft, non-tender, non-distended No hernias, masses, or organomegaly Normoactive bowel sounds.  Musculoskeletal:  No cyanosis, clubbing, or edema Skin:  No rashes, lesions, ulcers palpation of skin: no induration or nodules Neurologic:  CN 2-12  intact Sensation all 4 extremities intact Psychiatric:  Mental status Mood, affect appropriate Orientation to person, place, time  judgment and insight appear intact   I have personally reviewed the following:   Today's Data  Vitals  Lab Data  CBC BMP  Micro Data  Urine culture has had insignificant growth.  Imaging  CTA abdoment and pelvis  Cardiology Data    Other Data    Scheduled Meds:  docusate sodium  100 mg Oral  BID   folic acid  1 mg Oral Daily   heparin  5,000 Units Subcutaneous Q8H   multivitamin with minerals  1 tablet Oral Daily   nicotine  21 mg Transdermal Daily   pantoprazole  40 mg Oral Daily   thiamine  100 mg Oral Daily   Or   thiamine  100 mg Intravenous Daily   Continuous Infusions:  cefTRIAXone (ROCEPHIN)  IV Stopped (04/08/22 0906)   lactated ringers 125 mL/hr at 04/08/22 1841    Principal Problem:   AKI (acute kidney injury) ([place]) Active Problems:   Cystitis   Hydroureteronephrosis   Tobacco abuse   Alcohol abuse   Hyponatremia   Hypokalemia   LOS: 2 days   A & P  Assessment and Plan: * AKI (acute kidney injury) ([place]) Creatinine is minimally improved from 2.39 on admission to 2.32 today. Continue IV fluids. Avoid nephrotoxins and hypotension. Monitor creatinine and electrolytes. Due in part to functional obstruction of ureters by severe cystitis. Continue IV antibiotics. Urology is aware. Upon admission they did not feel there was a role for urological services at that point. However, patient does not seem to be improving. Unless she is better tomorrow morning, they need to be consulted.   Hypokalemia Resolved.  Continue to monitor.  Hyponatremia Sodium is improved from 132 to 137. Monitor.  Alcohol abuse Noted. The patient is on a CIWA protocol.  Tobacco abuse Noted. Nicotine patch has been made available.  Hydroureteronephrosis CT report suggests that this is due to severe inflammation related to cystitis, although transitional cell malignancy is a possibility as well. Monitor. Consult urology tomorrow if not much improved.   Cystitis Severe. Radiology suggests that inflammation of the bladder is causing swelling of the ureters and a functional obstruction. Continue antibiotics. Urology to be consulted tomorrow if not much improved.   I have seen and examined this patient myself. I have spent 36 minutes in her evaluation and treatment.  DVT prophylaxis:  Heparin Code Status: DNR Family Communication: None available Disposition Plan: Home    Wylder Macomber, DO [facility] Direct contact: see [email or web address]  7PM-7AM contact night coverage as above 04/08/2022, 6:49 PM  LOS: 1 day

## 2022-04-08 NOTE — Progress Notes (Signed)
Pt called out for pain medication asking for dilaudid and oxycodone and dilaudid at the same time. Nurse educated the patient on the importance of rotating PRN pain medication to stay ahead of the pain and that PRN medications were brought by patient request not at a scheduled time. Patient became agitated and irritable. Nurse wrote the next time available for her PRN pain medication. Will follow up in 45 minutes and give dilaudid as directed.

## 2022-04-09 DIAGNOSIS — N179 Acute kidney failure, unspecified: Secondary | ICD-10-CM | POA: Diagnosis not present

## 2022-04-09 LAB — CBC WITH DIFFERENTIAL/PLATELET
Abs Immature Granulocytes: 0.05 10*3/uL (ref 0.00–0.07)
Basophils Absolute: 0.1 10*3/uL (ref 0.0–0.1)
Basophils Relative: 1 %
Eosinophils Absolute: 0.3 10*3/uL (ref 0.0–0.5)
Eosinophils Relative: 2 %
HCT: 36.1 % (ref 36.0–46.0)
Hemoglobin: 11.6 g/dL — ABNORMAL LOW (ref 12.0–15.0)
Immature Granulocytes: 0 %
Lymphocytes Relative: 13 %
Lymphs Abs: 1.7 10*3/uL (ref 0.7–4.0)
MCH: 33.2 pg (ref 26.0–34.0)
MCHC: 32.1 g/dL (ref 30.0–36.0)
MCV: 103.4 fL — ABNORMAL HIGH (ref 80.0–100.0)
Monocytes Absolute: 1 10*3/uL (ref 0.1–1.0)
Monocytes Relative: 8 %
Neutro Abs: 10 10*3/uL — ABNORMAL HIGH (ref 1.7–7.7)
Neutrophils Relative %: 76 %
Platelets: 539 10*3/uL — ABNORMAL HIGH (ref 150–400)
RBC: 3.49 MIL/uL — ABNORMAL LOW (ref 3.87–5.11)
RDW: 14 % (ref 11.5–15.5)
WBC: 13 10*3/uL — ABNORMAL HIGH (ref 4.0–10.5)
nRBC: 0 % (ref 0.0–0.2)

## 2022-04-09 LAB — BASIC METABOLIC PANEL
Anion gap: 15 (ref 5–15)
BUN: 18 mg/dL (ref 8–23)
CO2: 17 mmol/L — ABNORMAL LOW (ref 22–32)
Calcium: 9 mg/dL (ref 8.9–10.3)
Chloride: 108 mmol/L (ref 98–111)
Creatinine, Ser: 2.21 mg/dL — ABNORMAL HIGH (ref 0.44–1.00)
GFR, Estimated: 24 mL/min — ABNORMAL LOW (ref >60)
Glucose, Bld: 72 mg/dL (ref 70–99)
Potassium: 4.2 mmol/L (ref 3.5–5.1)
Sodium: 140 mmol/L (ref 135–145)

## 2022-04-09 NOTE — Plan of Care (Signed)

## 2022-04-09 NOTE — Progress Notes (Signed)
PROGRESS NOTE  Betty [name] GQB:[id number] DOB: [birth year] DOA: 04/06/2022 PCP: Pcp, No  HPI/Recap of past 24 hours:  Betty [name] is a 66 y.o. female with medical history significant of HTN, tobacco abuse, EtOH abuse, GERD. Presenting with right flank pain. She reports her symptoms began with incontinence 2 months ago. She had a few episodes and became concerned as this had never happened before. She went to Urgent Care. Nothing was found on w/u. She was sent home with abx for 7 days. Her symptoms did not improve. She reports constant bladder incontinence since. She also started having intermittent right flank pain that got worse over the last week. It's a sharp, stabbing pain that radiates into her stomach. It has caused her to have N/V. She denies fever, diarrhea at home. She's tried advil and APAP to relieve her symptoms, but they have not helped. When her symptoms did not improve this morning, she decided to come to the ED for evaluation. She denies any other aggravating or alleviating factors.    In the ED CT abdomen and pelvis demonstrated:  1. Mild bilateral hydroureteronephrosis. Both ureters are dilated down to the pelvis just proximal to the UVJ on each side where the ureteral urothelium may hyper enhance. This is associated with circumferential bladder wall thickening suggesting cystitis. 2. Marked collar of low-density material measuring 6 mm in thickness surrounding the proximal and mid right ureter. This probably reflects marked edema of the ureteral wall although contained periureteric edema or lymphoma cannot be excluded. Underlying transitional cell carcinoma is a consideration Urology was contacted by EDP. Recommendation is to reduce inflammation with IV antibiotics, and call urology if symptoms do not improve over the next day.   [facility] were consulted to admit the patient for further evaluation and treatment. The patient was admitted to a telemetry bed on a  CIWA protocol.    Monitor creatinine, electrolytes, and CBC.   04/09/2022: The patient was seen and examined at bedside.  Endorses feeling fatigued.  Denies dysuria.   Consultants  None   Procedures  None  Assessment/Plan: Principal Problem:   AKI (acute kidney injury) ([place]) Active Problems:   Cystitis   Hydroureteronephrosis   Tobacco abuse   Alcohol abuse   Hyponatremia   Hypokalemia  * AKI (acute kidney injury) ([place]) Creatinine is improving, continue IV fluid. Continue to follow for toxic agents, dehydration and hypotension Continue to monitor urine output Repeat renal panel in the morning   Resolved hypokalemia   Resolved hyponatremia   Alcohol abuse Alcohol cessation counseled on at bedside. The patient is on a CIWA protocol.   Tobacco abuse Noted. Nicotine patch has been made available.   Hydroureteronephrosis Will need to follow-up with urology outpatient.   Questionable cystitis Denies dysuria. Urine culture negative for pyuria, Rocephin DC'd on 04/09/2022.  Anion gap metabolic acidosis Serum bicarb 17, anion gap 15 Continue IV fluid hydration Repeat renal panel in the morning.      DVT prophylaxis: Heparin SQ 3 times daily Code Status: DNR Family Communication: None available Disposition Plan: Home        Status is: Inpatient     Objective: Vitals:   04/08/22 2212 04/09/22 0555 04/09/22 1430 04/09/22 1714  BP: (!) 185/88 (!) 172/87 (!) 188/96 (!) 170/84  Pulse: 86 91 86 79  Resp: '16 18 13   '$ Temp: 98.2 F (36.8 C) 98.1 F (36.7 C) 98.7 F (37.1 C)   TempSrc: Oral Oral Oral   SpO2: 90%  94% 93%   Weight:      Height:        Intake/Output Summary (Last 24 hours) at 04/09/2022 1739 Last data filed at 04/09/2022 1100 Gross per 24 hour  Intake 2809.69 ml  Output 600 ml  Net 2209.69 ml   Filed Weights   04/06/22 1428  Weight: 53.6 kg    Exam:  General: 66 y.o. year-old female well developed well nourished in no acute  distress.  Alert and oriented x3. Cardiovascular: Regular rate and rhythm with no rubs or gallops.  No thyromegaly or JVD noted.   Respiratory: Clear to auscultation with no wheezes or rales. Good inspiratory effort. Abdomen: Soft nontender nondistended with normal bowel sounds x4 quadrants. Musculoskeletal: No lower extremity edema. 2/4 pulses in all 4 extremities. Skin: No ulcerative lesions noted or rashes, Psychiatry: Mood is appropriate for condition and setting   Data Reviewed: CBC: Recent Labs  Lab 04/06/22 0555 04/07/22 0629 04/08/22 0524 04/09/22 0712  WBC 22.0* 13.4* 13.2* 13.0*  NEUTROABS 19.0*  --  10.7* 10.0*  HGB 13.0 12.4 10.5* 11.6*  HCT 37.2 37.1 31.4* 36.1  MCV 96.4 101.6* 99.7 103.4*  PLT 426* 408* 408* 347*   Basic Metabolic Panel: Recent Labs  Lab 04/06/22 0555 04/07/22 0629 04/07/22 1848 04/08/22 0524 04/09/22 0712  NA 132* 138 137 137 140  K 3.6 2.9* 3.8 3.5 4.2  CL 97* 105 106 107 108  CO2 21* 19* 21* 19* 17*  GLUCOSE 100* 76 77 74 72  BUN 25* '23 23 21 18  '$ CREATININE 2.39* 2.32* 2.25* 2.30* 2.21*  CALCIUM 9.0 9.3 8.9 8.8* 9.0  MG  --   --   --  1.4*  --    GFR: Estimated Creatinine Clearance: 21.8 mL/min (A) (by C-G formula based on SCr of 2.21 mg/dL (H)). Liver Function Tests: Recent Labs  Lab 04/06/22 0555 04/07/22 0629  AST 26 14*  ALT 10 9  ALKPHOS 78 75  BILITOT 1.1 0.4  PROT 7.5 7.2  ALBUMIN 3.8 3.7   Recent Labs  Lab 04/06/22 0555  LIPASE 24   No results for input(s): "AMMONIA" in the last 168 hours. Coagulation Profile: No results for input(s): "INR", "PROTIME" in the last 168 hours. Cardiac Enzymes: No results for input(s): "CKTOTAL", "CKMB", "CKMBINDEX", "TROPONINI" in the last 168 hours. BNP (last 3 results) No results for input(s): "PROBNP" in the last 8760 hours. HbA1C: No results for input(s): "HGBA1C" in the last 72 hours. CBG: No results for input(s): "GLUCAP" in the last 168 hours. Lipid Profile: No  results for input(s): "CHOL", "HDL", "LDLCALC", "TRIG", "CHOLHDL", "LDLDIRECT" in the last 72 hours. Thyroid Function Tests: No results for input(s): "TSH", "T4TOTAL", "FREET4", "T3FREE", "THYROIDAB" in the last 72 hours. Anemia Panel: No results for input(s): "VITAMINB12", "FOLATE", "FERRITIN", "TIBC", "IRON", "RETICCTPCT" in the last 72 hours. Urine analysis:    Component Value Date/Time   COLORURINE STRAW (A) 04/06/2022 0555   APPEARANCEUR CLEAR 04/06/2022 0555   LABSPEC 1.009 04/06/2022 0555   PHURINE 5.0 04/06/2022 0555   GLUCOSEU NEGATIVE 04/06/2022 0555   HGBUR SMALL (A) 04/06/2022 0555   BILIRUBINUR NEGATIVE 04/06/2022 0555   KETONESUR NEGATIVE 04/06/2022 0555   PROTEINUR NEGATIVE 04/06/2022 0555   NITRITE NEGATIVE 04/06/2022 0555   LEUKOCYTESUR NEGATIVE 04/06/2022 0555   Sepsis Labs: '@LABRCNTIP'$ (procalcitonin:4,lacticidven:4)  ) Recent Results (from the past 240 hour(s))  Urine Culture     Status: Abnormal   Collection Time: 04/06/22  8:31 AM   Specimen: Urine, Clean  Catch  Result Value Ref Range Status   Specimen Description   Final    URINE, CLEAN CATCH Performed at [facility], [place] [address] [place], [place] [address]    Special Requests   Final    NONE Performed at [facility], [place] [address], [place], [place] [address]    Culture (A)  Final    <10,000 COLONIES/mL INSIGNIFICANT GROWTH Performed at [place] [address] [place],  [address]    Report Status 04/07/2022 FINAL  Final      Studies: No results found.  Scheduled Meds:  docusate sodium  100 mg Oral BID   folic acid  1 mg Oral Daily   heparin  5,000 Units Subcutaneous Q8H   multivitamin with minerals  1 tablet Oral Daily   nicotine  21 mg Transdermal Daily   pantoprazole  40 mg Oral Daily   thiamine  100 mg Oral Daily   Or   thiamine  100 mg Intravenous Daily    Continuous Infusions:  lactated ringers 125 mL/hr at 04/09/22  1713     LOS: 3 days     Kayleen Memos, MD [facility] Pager [phone]  If 7PM-7AM, please contact night-coverage [email or web address] Password [facility] 04/09/2022, 5:39 PM

## 2022-04-09 NOTE — Plan of Care (Signed)
Pt rating pain 9/10.  PRNs administered per MAR.  Pt continues to get out of bed impulsively despite being unsteady and needing assistance to ambulate safely.  Bed alarm on.  Problem: Pain Managment: Goal: General experience of comfort will improve Outcome: Not Progressing   Problem: Safety: Goal: Ability to remain free from injury will improve Outcome: Not Progressing

## 2022-04-10 ENCOUNTER — Inpatient Hospital Stay (HOSPITAL_COMMUNITY): Payer: Medicare Other

## 2022-04-10 DIAGNOSIS — N179 Acute kidney failure, unspecified: Secondary | ICD-10-CM | POA: Diagnosis not present

## 2022-04-10 LAB — CBC
HCT: 35.3 % — ABNORMAL LOW (ref 36.0–46.0)
Hemoglobin: 11.4 g/dL — ABNORMAL LOW (ref 12.0–15.0)
MCH: 33.1 pg (ref 26.0–34.0)
MCHC: 32.3 g/dL (ref 30.0–36.0)
MCV: 102.6 fL — ABNORMAL HIGH (ref 80.0–100.0)
Platelets: 538 10*3/uL — ABNORMAL HIGH (ref 150–400)
RBC: 3.44 MIL/uL — ABNORMAL LOW (ref 3.87–5.11)
RDW: 14.2 % (ref 11.5–15.5)
WBC: 14 10*3/uL — ABNORMAL HIGH (ref 4.0–10.5)
nRBC: 0 % (ref 0.0–0.2)

## 2022-04-10 LAB — RENAL FUNCTION PANEL
Albumin: 3.1 g/dL — ABNORMAL LOW (ref 3.5–5.0)
Anion gap: 16 — ABNORMAL HIGH (ref 5–15)
BUN: 16 mg/dL (ref 8–23)
CO2: 19 mmol/L — ABNORMAL LOW (ref 22–32)
Calcium: 8.9 mg/dL (ref 8.9–10.3)
Chloride: 108 mmol/L (ref 98–111)
Creatinine, Ser: 2.3 mg/dL — ABNORMAL HIGH (ref 0.44–1.00)
GFR, Estimated: 23 mL/min — ABNORMAL LOW (ref >60)
Glucose, Bld: 84 mg/dL (ref 70–99)
Phosphorus: 5 mg/dL — ABNORMAL HIGH (ref 2.5–4.6)
Potassium: 3.3 mmol/L — ABNORMAL LOW (ref 3.5–5.1)
Sodium: 143 mmol/L (ref 135–145)

## 2022-04-10 LAB — MAGNESIUM: Magnesium: 1.8 mg/dL (ref 1.7–2.4)

## 2022-04-10 MED ORDER — HYDRALAZINE HCL 20 MG/ML IJ SOLN
10.0000 mg | Freq: Four times a day (QID) | INTRAMUSCULAR | Status: DC | PRN
  Administered 2022-04-10 – 2022-04-12 (×2): 10 mg via INTRAVENOUS
  Filled 2022-04-10 (×2): qty 1

## 2022-04-10 MED ORDER — POTASSIUM CHLORIDE CRYS ER 20 MEQ PO TBCR
20.0000 meq | EXTENDED_RELEASE_TABLET | Freq: Once | ORAL | Status: AC
  Administered 2022-04-10: 20 meq via ORAL
  Filled 2022-04-10: qty 1

## 2022-04-10 MED ORDER — ONDANSETRON HCL 4 MG/2ML IJ SOLN
4.0000 mg | Freq: Four times a day (QID) | INTRAMUSCULAR | Status: DC | PRN
  Administered 2022-04-10: 4 mg via INTRAVENOUS
  Filled 2022-04-10 (×2): qty 2

## 2022-04-10 MED ORDER — SODIUM CHLORIDE 0.9 % IV SOLN
2.0000 g | INTRAVENOUS | Status: DC
  Administered 2022-04-10 – 2022-04-15 (×6): 2 g via INTRAVENOUS
  Filled 2022-04-10 (×6): qty 20

## 2022-04-10 MED ORDER — AMLODIPINE BESYLATE 5 MG PO TABS
5.0000 mg | ORAL_TABLET | Freq: Every day | ORAL | Status: DC
Start: 2022-04-10 — End: 2022-04-12
  Administered 2022-04-10 – 2022-04-12 (×3): 5 mg via ORAL
  Filled 2022-04-10 (×3): qty 1

## 2022-04-10 MED ORDER — SODIUM BICARBONATE 650 MG PO TABS
650.0000 mg | ORAL_TABLET | Freq: Three times a day (TID) | ORAL | Status: DC
  Administered 2022-04-10 – 2022-04-14 (×14): 650 mg via ORAL
  Filled 2022-04-10 (×14): qty 1

## 2022-04-10 MED ORDER — MAGNESIUM SULFATE 2 GM/50ML IV SOLN
2.0000 g | Freq: Once | INTRAVENOUS | Status: AC
  Administered 2022-04-10: 2 g via INTRAVENOUS
  Filled 2022-04-10: qty 50

## 2022-04-10 MED ORDER — SENNOSIDES-DOCUSATE SODIUM 8.6-50 MG PO TABS
1.0000 | ORAL_TABLET | Freq: Two times a day (BID) | ORAL | Status: DC
  Administered 2022-04-10 – 2022-04-17 (×12): 1 via ORAL
  Filled 2022-04-10 (×15): qty 1

## 2022-04-10 NOTE — Consult Note (Signed)
Urology Consult  Referring physician: B Regalado Reason for referral: Hydronephrosis  Chief Complaint: hydronephrosis  History of Present Illness: The patient was admitted on July 23 with 53-monthhistory of worsening right-sided abdominal and back pain.  It has been significant in severity.  She has had some nausea with a little bit of vomiting.  No cystitis symptoms.  She initially had an elevated white blood count of 22.  She had a low-grade temperature of 99.7.  Serum creatinine was 2.39.  There was no previous serum creatinine.  The hospital service has been talking to urology for a number of days  Urine culture negative.  Serum creatinine 2.3 for the last 3 days.  Patient had a CT scan.  She had mild bilateral hydronephrosis.  There was a collar of low density material measuring 6 mm in the proximal and mid right ureter.  Both ureters were dilated down to the pelvis just proximal to the ureterovesical junction where the urothelium was hyper enhance.  There was circumferential bladder wall thickening.  It was thought that the color of tissue probably was edema of the ureteral wall.  Lymphoma could not be excluded underlying transitional cell carcinoma a consideration.  The patient normally voids every 2 hours gets up 3 times a night.  No significant history of bladder infections.  No previous bladder surgery or stones  In the last month she started leaking at night while she was asleep.  She had little bit her urgency during the day  `    Past Medical History:  Diagnosis Date   GERD (gastroesophageal reflux disease)    HTN (hypertension)    History reviewed. No pertinent surgical history.  Medications: I have reviewed the patient's current medications. Allergies: No Known Allergies  History reviewed. No pertinent family history. Social History:  reports that she has been smoking cigarettes. She has been smoking an average of 2 packs per day. She does not have any smokeless tobacco  history on file. No history on file for alcohol use and drug use.  ROS: All systems are reviewed and negative except as noted. Rest negative  Physical Exam:  Vital signs in last 24 hours: Temp:  [97.8 F (36.6 C)-98.7 F (37.1 C)] 98.2 F (36.8 C) (07/26 0653) Pulse Rate:  [79-95] 85 (07/26 0759) Resp:  [13-18] 18 (07/26 0653) BP: (170-194)/(84-118) 188/100 (07/26 1022) SpO2:  [91 %-94 %] 91 % (07/26 0653)  Cardiovascular: Skin warm; not flushed Respiratory: Breaths quiet; no shortness of breath Abdomen: No masses Neurological: Normal sensation to touch Musculoskeletal: Normal motor function arms and legs Lymphatics: No inguinal adenopathy Skin: No rashes Genitourinary: Mild tenderness right abdomen and right flank  Laboratory Data:  Results for orders placed or performed during the hospital encounter of 04/06/22 (from the past 72 hour(s))  Basic metabolic panel     Status: Abnormal   Collection Time: 04/07/22  6:48 PM  Result Value Ref Range   Sodium 137 135 - 145 mmol/L   Potassium 3.8 3.5 - 5.1 mmol/L    Comment: DELTA CHECK NOTED   Chloride 106 98 - 111 mmol/L   CO2 21 (L) 22 - 32 mmol/L   Glucose, Bld 77 70 - 99 mg/dL    Comment: Glucose reference range applies only to samples taken after fasting for at least 8 hours.   BUN 23 8 - 23 mg/dL   Creatinine, Ser 2.25 (H) 0.44 - 1.00 mg/dL   Calcium 8.9 8.9 - 10.3 mg/dL   GFR, Estimated  24 (L) >60 mL/min    Comment: (NOTE) Calculated using the CKD-EPI Creatinine Equation (2021)    Anion gap 10 5 - 15    Comment: Performed at [facility], [place] [address] [place], [place] [address]  CBC with Differential/Platelet     Status: Abnormal   Collection Time: 04/08/22  5:24 AM  Result Value Ref Range   WBC 13.2 (H) 4.0 - 10.5 K/uL   RBC 3.15 (L) 3.87 - 5.11 MIL/uL   Hemoglobin 10.5 (L) 12.0 - 15.0 g/dL   HCT 31.4 (L) 36.0 - 46.0 %   MCV 99.7 80.0 - 100.0 fL   MCH 33.3 26.0 - 34.0 pg   MCHC 33.4 30.0 -  36.0 g/dL   RDW 14.2 11.5 - 15.5 %   Platelets 408 (H) 150 - 400 K/uL   nRBC 0.0 0.0 - 0.2 %   Neutrophils Relative % 81 %   Neutro Abs 10.7 (H) 1.7 - 7.7 K/uL   Lymphocytes Relative 9 %   Lymphs Abs 1.2 0.7 - 4.0 K/uL   Monocytes Relative 7 %   Monocytes Absolute 1.0 0.1 - 1.0 K/uL   Eosinophils Relative 1 %   Eosinophils Absolute 0.1 0.0 - 0.5 K/uL   Basophils Relative 1 %   Basophils Absolute 0.1 0.0 - 0.1 K/uL   Immature Granulocytes 1 %   Abs Immature Granulocytes 0.07 0.00 - 0.07 K/uL    Comment: Performed at [facility], [place] [address] [place], [place], [place] [address]  Basic metabolic panel     Status: Abnormal   Collection Time: 04/08/22  5:24 AM  Result Value Ref Range   Sodium 137 135 - 145 mmol/L   Potassium 3.5 3.5 - 5.1 mmol/L   Chloride 107 98 - 111 mmol/L   CO2 19 (L) 22 - 32 mmol/L   Glucose, Bld 74 70 - 99 mg/dL    Comment: Glucose reference range applies only to samples taken after fasting for at least 8 hours.   BUN 21 8 - 23 mg/dL   Creatinine, Ser 2.30 (H) 0.44 - 1.00 mg/dL   Calcium 8.8 (L) 8.9 - 10.3 mg/dL   GFR, Estimated 23 (L) >60 mL/min    Comment: (NOTE) Calculated using the CKD-EPI Creatinine Equation (2021)    Anion gap 11 5 - 15    Comment: Performed at [facility], [place] [address], [place], [place] [address]  Magnesium     Status: Abnormal   Collection Time: 04/08/22  5:24 AM  Result Value Ref Range   Magnesium 1.4 (L) 1.7 - 2.4 mg/dL    Comment: Performed at [facility], [place] [address] [place], [place] [address]  CBC with Differential/Platelet     Status: Abnormal   Collection Time: 04/09/22  7:12 AM  Result Value Ref Range   WBC 13.0 (H) 4.0 - 10.5 K/uL   RBC 3.49 (L) 3.87 - 5.11 MIL/uL   Hemoglobin 11.6 (L) 12.0 - 15.0 g/dL   HCT 36.1 36.0 - 46.0 %   MCV 103.4 (H) 80.0 - 100.0 fL   MCH 33.2 26.0 - 34.0 pg   MCHC 32.1 30.0 - 36.0 g/dL   RDW 14.0 11.5 - 15.5 %   Platelets 539 (H)  150 - 400 K/uL   nRBC 0.0 0.0 - 0.2 %   Neutrophils Relative % 76 %   Neutro Abs 10.0 (H) 1.7 - 7.7 K/uL   Lymphocytes Relative 13 %   Lymphs  Abs 1.7 0.7 - 4.0 K/uL   Monocytes Relative 8 %   Monocytes Absolute 1.0 0.1 - 1.0 K/uL   Eosinophils Relative 2 %   Eosinophils Absolute 0.3 0.0 - 0.5 K/uL   Basophils Relative 1 %   Basophils Absolute 0.1 0.0 - 0.1 K/uL   Immature Granulocytes 0 %   Abs Immature Granulocytes 0.05 0.00 - 0.07 K/uL    Comment: Performed at [facility], [place] [address], [place], [place] [address]  Basic metabolic panel     Status: Abnormal   Collection Time: 04/09/22  7:12 AM  Result Value Ref Range   Sodium 140 135 - 145 mmol/L   Potassium 4.2 3.5 - 5.1 mmol/L    Comment: HEMOLYSIS AT THIS LEVEL MAY AFFECT RESULT DELTA CHECK NOTED SLIGHT HEMOLYSIS    Chloride 108 98 - 111 mmol/L   CO2 17 (L) 22 - 32 mmol/L   Glucose, Bld 72 70 - 99 mg/dL    Comment: Glucose reference range applies only to samples taken after fasting for at least 8 hours.   BUN 18 8 - 23 mg/dL   Creatinine, Ser 2.21 (H) 0.44 - 1.00 mg/dL   Calcium 9.0 8.9 - 10.3 mg/dL   GFR, Estimated 24 (L) >60 mL/min    Comment: (NOTE) Calculated using the CKD-EPI Creatinine Equation (2021)    Anion gap 15 5 - 15    Comment: Performed at [facility], [place] [address] [place], [place] [address]  Renal function panel     Status: Abnormal   Collection Time: 04/10/22  5:41 AM  Result Value Ref Range   Sodium 143 135 - 145 mmol/L   Potassium 3.3 (L) 3.5 - 5.1 mmol/L    Comment: DELTA CHECK NOTED   Chloride 108 98 - 111 mmol/L   CO2 19 (L) 22 - 32 mmol/L   Glucose, Bld 84 70 - 99 mg/dL    Comment: Glucose reference range applies only to samples taken after fasting for at least 8 hours.   BUN 16 8 - 23 mg/dL   Creatinine, Ser 2.30 (H) 0.44 - 1.00 mg/dL   Calcium 8.9 8.9 - 10.3 mg/dL   Phosphorus 5.0 (H) 2.5 - 4.6 mg/dL   Albumin 3.1 (L) 3.5 - 5.0 g/dL   GFR,  Estimated 23 (L) >60 mL/min    Comment: (NOTE) Calculated using the CKD-EPI Creatinine Equation (2021)    Anion gap 16 (H) 5 - 15    Comment: Performed at [facility], [place] [address] [place], [place] [address]  CBC     Status: Abnormal   Collection Time: 04/10/22  6:40 AM  Result Value Ref Range   WBC 14.0 (H) 4.0 - 10.5 K/uL   RBC 3.44 (L) 3.87 - 5.11 MIL/uL   Hemoglobin 11.4 (L) 12.0 - 15.0 g/dL   HCT 35.3 (L) 36.0 - 46.0 %   MCV 102.6 (H) 80.0 - 100.0 fL   MCH 33.1 26.0 - 34.0 pg   MCHC 32.3 30.0 - 36.0 g/dL   RDW 14.2 11.5 - 15.5 %   Platelets 538 (H) 150 - 400 K/uL   nRBC 0.0 0.0 - 0.2 %    Comment: Performed at [facility], [place] [address], [place], [place] [address]  Magnesium     Status: None   Collection Time: 04/10/22  6:40 AM  Result Value Ref Range   Magnesium 1.8 1.7 - 2.4 mg/dL    Comment: Performed at  [facility], [place] [address], [place], [place] [address]   Recent Results (from the past 240 hour(s))  Urine Culture     Status: Abnormal   Collection Time: 04/06/22  8:31 AM   Specimen: Urine, Clean Catch  Result Value Ref Range Status   Specimen Description   Final    URINE, CLEAN CATCH Performed at [facility], [place] [address] [place], [place] [address]    Special Requests   Final    NONE Performed at [facility], [place] [address], [place], [place] [address]    Culture (A)  Final    <10,000 COLONIES/mL INSIGNIFICANT GROWTH Performed at [place] [address], [place], [place] [address]    Report Status 04/07/2022 FINAL  Final   Creatinine: Recent Labs    04/06/22 0555 04/07/22 0629 04/07/22 1848 04/08/22 0524 04/09/22 0712 04/10/22 0541  CREATININE 2.39* 2.32* 2.25* 2.30* 2.21* 2.30*    Xrays: See report/chart Reviewed  Impression/Assessment:  Patient has bilateral hydronephrosis and very impressive findings in the upper two thirds  of the right ureter.  I went over the x-ray with radiology.  She has hyperenhancement of the urothelium of the ureter but a thickened wall likely edema.  Plan: I drew the patient a picture.  She has impressive edema of the right ureter.  Her presentation is in keeping with a pyelonephritis and associated infection and inflammation of the ureter.  There is no stone.  There is nothing to biopsy in my opinion.  She will need supportive care and broad-spectrum antibiotics in spite of the culture results.  I would recommend for her to see Dr. Tresa Moore in follow-up.  I think she will recommend a repeat CT scan in a few months to make sure the ureter has normalized.  The rare differential diagnosis will be kept in mind.  I also gave the patient Dr. Tresa Moore contact.  We will continue to follow.  In my opinion there is no role for surgery or stent at this time   Nicki Reaper A Corrina Steffensen 04/10/2022, 11:53 AM

## 2022-04-10 NOTE — Progress Notes (Addendum)
PROGRESS NOTE    Betty [name]  LOV:[id number] DOB: 1955/09/27 DOA: 04/06/2022 PCP: Pcp, No   Brief Narrative: 66 year old with past medical history significant for hypertension, tobacco abuse, EtOH abuse, GERD presenting with right-sided flank pain.  She reports her symptoms began with incontinence 2 months ago.  She had a few episodes and became concerned.  She went to urgent care.  Nothing was found on work-up.  She was sent home with 7 days of antibiotics.  Her symptoms did not improve.  She is also complaining of intermittent right flank pain that got worse over the week.  She presented with worsening symptoms to the ED.  CT abdomen and pelvis: Mild bilateral hydroureteronephrosis.  Both ureters are dilated down to the pelvis just proximal to the UVJ on each side where the ureteral urothelium may hyper enhance.  This is associated with circumferential bladder wall thickening suggestive of cystitis.  Marked: Low-density material measuring 6 mm in thickness surrounding the proximal and right mid ureter.  This probably reflects marked edema of the ureteral wall although contained.  Ureteric edema or lymphoma cannot be excluded.  Initially ED discussed with urology who recommend IV fluids and antibiotics.  Continue to have flank pain, renal function without significant improvement.  Urology has been formally consulted.    Assessment & Plan:   Principal Problem:   AKI (acute kidney injury) ([place]) Active Problems:   Cystitis   Hydroureteronephrosis   Tobacco abuse   Alcohol abuse   Hyponatremia   Hypokalemia  1-AKI: In the setting of bilateral hydroureteronephrosis No prior renal function records Presented with a creatinine of 2.3 Continue with IV fluids Renal ultrasound:Moderate hydronephrosis and bilateral proximal hydroureter. Ureteral jets in the urinary bladder were not identified during a five-minute observation. Continue with IV fluids We will formally consult urology Urology  recommends continuation of IV antibiotics will resume  2-Anion gap metabolic acidosis: In The setting of AKI.  Continue with IV fluids Start sodium bicarb tablet.   Hypokalemia: Replete orally.  Mg 1.8. will give one time IV dose Hyponatremia: Resolved Alcohol abuse: Counseling provided CIWA protocol  Tobacco abuse: Continue with nicotine patch  Hydroureteronephrosis: Urology consulted  Constipation; KUB negative. Started on laxative.   Hypertension:  Start Norvasc.  IV hydralazine PRN   Estimated body mass index is 19.07 kg/m as calculated from the following:   Height as of this encounter: '5\' 6"'$  (1.676 m).   Weight as of this encounter: 53.6 kg.   DVT prophylaxis: Heparin  Code Status: DNR Family Communication: Care discussed with patient  Disposition Plan:  Status is: Inpatient Remains inpatient appropriate because: Remain inpatient for management of hydroureteronephrosis.     Consultants:  Urology  Procedures:  Renal US.   Antimicrobials:    Subjective: She continue to have back pain. Also report abdominal pain. No BM   Objective: Vitals:   04/09/22 1430 04/09/22 1714 04/09/22 2049 04/10/22 0653  BP: (!) 188/96 (!) 170/84 (!) 178/97 (!) 194/118  Pulse: 86 79 95 90  Resp: '13  18 18  '$ Temp: 98.7 F (37.1 C)  97.8 F (36.6 C) 98.2 F (36.8 C)  TempSrc: Oral  Oral Oral  SpO2: 93%  94% 91%  Weight:      Height:        Intake/Output Summary (Last 24 hours) at 04/10/2022 0739 Last data filed at 04/10/2022 0126 Gross per 24 hour  Intake 2596.44 ml  Output 300 ml  Net 2296.44 ml   [employer]  04/06/22 1428  Weight: 53.6 kg    Examination:  General exam: Appears calm and comfortable  Respiratory system: Clear to auscultation. Respiratory effort normal. Cardiovascular system: S1 & S2 heard, RRR. No JVD, murmurs, rubs, gallops or clicks. No pedal edema. Gastrointestinal system: Abdomen is nondistended, soft and nontender. No organomegaly or  masses felt. Normal bowel sounds heard. Central nervous system: Alert and oriented.  Extremities: Symmetric 5 x 5 power.  Data Reviewed: I have personally reviewed following labs and imaging studies  CBC: Recent Labs  Lab 04/06/22 0555 04/07/22 0629 04/08/22 0524 04/09/22 0712  WBC 22.0* 13.4* 13.2* 13.0*  NEUTROABS 19.0*  --  10.7* 10.0*  HGB 13.0 12.4 10.5* 11.6*  HCT 37.2 37.1 31.4* 36.1  MCV 96.4 101.6* 99.7 103.4*  PLT 426* 408* 408* 937*   Basic Metabolic Panel: Recent Labs  Lab 04/07/22 0629 04/07/22 1848 04/08/22 0524 04/09/22 0712 04/10/22 0541  NA 138 137 137 140 143  K 2.9* 3.8 3.5 4.2 3.3*  CL 105 106 107 108 108  CO2 19* 21* 19* 17* 19*  GLUCOSE 76 77 74 72 84  BUN '23 23 21 18 16  '$ CREATININE 2.32* 2.25* 2.30* 2.21* 2.30*  CALCIUM 9.3 8.9 8.8* 9.0 8.9  MG  --   --  1.4*  --   --   PHOS  --   --   --   --  5.0*   GFR: Estimated Creatinine Clearance: 20.9 mL/min (A) (by C-G formula based on SCr of 2.3 mg/dL (H)). Liver Function Tests: Recent Labs  Lab 04/06/22 0555 04/07/22 0629 04/10/22 0541  AST 26 14*  --   ALT 10 9  --   ALKPHOS 78 75  --   BILITOT 1.1 0.4  --   PROT 7.5 7.2  --   ALBUMIN 3.8 3.7 3.1*   Recent Labs  Lab 04/06/22 0555  LIPASE 24   No results for input(s): "AMMONIA" in the last 168 hours. Coagulation Profile: No results for input(s): "INR", "PROTIME" in the last 168 hours. Cardiac Enzymes: No results for input(s): "CKTOTAL", "CKMB", "CKMBINDEX", "TROPONINI" in the last 168 hours. BNP (last 3 results) No results for input(s): "PROBNP" in the last 8760 hours. HbA1C: No results for input(s): "HGBA1C" in the last 72 hours. CBG: No results for input(s): "GLUCAP" in the last 168 hours. Lipid Profile: No results for input(s): "CHOL", "HDL", "LDLCALC", "TRIG", "CHOLHDL", "LDLDIRECT" in the last 72 hours. Thyroid Function Tests: No results for input(s): "TSH", "T4TOTAL", "FREET4", "T3FREE", "THYROIDAB" in the last 72  hours. Anemia Panel: No results for input(s): "VITAMINB12", "FOLATE", "FERRITIN", "TIBC", "IRON", "RETICCTPCT" in the last 72 hours. Sepsis Labs: No results for input(s): "PROCALCITON", "LATICACIDVEN" in the last 168 hours.  Recent Results (from the past 240 hour(s))  Urine Culture     Status: Abnormal   Collection Time: 04/06/22  8:31 AM   Specimen: Urine, Clean Catch  Result Value Ref Range Status   Specimen Description   Final    URINE, CLEAN CATCH Performed at [facility], [place] [address] [place], [place] [address]    Special Requests   Final    NONE Performed at [facility], [place] [address] [place], [place] [address]    Culture (A)  Final    <10,000 COLONIES/mL INSIGNIFICANT GROWTH Performed at [place] [address] [place], [place] [address]    Report Status 04/07/2022 FINAL  Final         Radiology  Studies: No results found.      Scheduled Meds:  amLODipine  5 mg Oral Daily   docusate sodium  100 mg Oral BID   folic acid  1 mg Oral Daily   heparin  5,000 Units Subcutaneous Q8H   multivitamin with minerals  1 tablet Oral Daily   nicotine  21 mg Transdermal Daily   pantoprazole  40 mg Oral Daily   thiamine  100 mg Oral Daily   Or   thiamine  100 mg Intravenous Daily   Continuous Infusions:  lactated ringers 125 mL/hr at 04/10/22 0126     LOS: 4 days    Time spent: 35 minutes    Lessa Huge A Giannah Zavadil, MD [facility]   If 7PM-7AM, please contact night-coverage [email or web address]  04/10/2022, 7:39 AM

## 2022-04-11 DIAGNOSIS — N179 Acute kidney failure, unspecified: Secondary | ICD-10-CM | POA: Diagnosis not present

## 2022-04-11 LAB — BASIC METABOLIC PANEL
Anion gap: 19 — ABNORMAL HIGH (ref 5–15)
BUN: 14 mg/dL (ref 8–23)
CO2: 20 mmol/L — ABNORMAL LOW (ref 22–32)
Calcium: 8.9 mg/dL (ref 8.9–10.3)
Chloride: 106 mmol/L (ref 98–111)
Creatinine, Ser: 2.57 mg/dL — ABNORMAL HIGH (ref 0.44–1.00)
GFR, Estimated: 20 mL/min — ABNORMAL LOW (ref >60)
Glucose, Bld: 78 mg/dL (ref 70–99)
Potassium: 3.5 mmol/L (ref 3.5–5.1)
Sodium: 145 mmol/L (ref 135–145)

## 2022-04-11 LAB — CBC
HCT: 35.8 % — ABNORMAL LOW (ref 36.0–46.0)
Hemoglobin: 12 g/dL (ref 12.0–15.0)
MCH: 33.1 pg (ref 26.0–34.0)
MCHC: 33.5 g/dL (ref 30.0–36.0)
MCV: 98.9 fL (ref 80.0–100.0)
Platelets: 490 10*3/uL — ABNORMAL HIGH (ref 150–400)
RBC: 3.62 MIL/uL — ABNORMAL LOW (ref 3.87–5.11)
RDW: 14.2 % (ref 11.5–15.5)
WBC: 13.8 10*3/uL — ABNORMAL HIGH (ref 4.0–10.5)
nRBC: 0 % (ref 0.0–0.2)

## 2022-04-11 MED ORDER — HEPARIN SODIUM (PORCINE) 5000 UNIT/ML IJ SOLN: 5000.0000 [IU] | Freq: Three times a day (TID) | INTRAMUSCULAR | Status: DC

## 2022-04-11 NOTE — Care Management Important Message (Signed)
Important Message  Patient Details IM Letter given to the Patient. Name: Betty [name] MRN: [id number] Date of Birth: 07-Oct-1955   Medicare Important Message Given:  Yes     Kerin Salen 04/11/2022, 9:12 AM

## 2022-04-11 NOTE — Progress Notes (Signed)
Reviewed chart Cr mildly increased 2.57 but not eating well ? Prerenal WBC still mildly elevated Ultrasound shows bilateral hydro- hard to say if worse on right but it might be vs. CT scan- I will speak to radiology tonight Still has significant right sided abdominal pain and extends across anterior abdomen- soft to palpate Has pure wick for incontinence No history of TB Picture was drawn to patient  Patient has a very uncommon finding involving right (and left) ureter  Very rare to have bilateral hydro from cystitis   Spoke to my partner re her  Plan: foley catheter to decompress bladder and perc right kidney tomorrow (would rather not perc both sides and stent may not be tolerated well and could ? Worsen right ureteral inflammation  Will follow

## 2022-04-11 NOTE — Progress Notes (Signed)
PROGRESS NOTE    Betty [name]  VPX:[id number] DOB: May 13, 1956 DOA: 04/06/2022 PCP: Pcp, No   Brief Narrative: 66 year old with past medical history significant for hypertension, tobacco abuse, EtOH abuse, GERD presenting with right-sided flank pain.  She reports her symptoms began with incontinence 2 months ago.  She had a few episodes and became concerned.  She went to urgent care.  Nothing was found on work-up.  She was sent home with 7 days of antibiotics.  Her symptoms did not improve.  She is also complaining of intermittent right flank pain that got worse over the week.  She presented with worsening symptoms to the ED.  CT abdomen and pelvis: Mild bilateral hydroureteronephrosis.  Both ureters are dilated down to the pelvis just proximal to the UVJ on each side where the ureteral urothelium may hyper enhance.  This is associated with circumferential bladder wall thickening suggestive of cystitis.  Marked: Low-density material measuring 6 mm in thickness surrounding the proximal and right mid ureter.  This probably reflects marked edema of the ureteral wall although contained.  Ureteric edema or lymphoma cannot be excluded.  Initially ED discussed with urology who recommend IV fluids and antibiotics.  Continue to have flank pain, renal function without significant improvement.  Urology has been formally consulted. IV antibiotics resume.     Assessment & Plan:   Principal Problem:   AKI (acute kidney injury) ([place]) Active Problems:   Cystitis   Hydroureteronephrosis   Tobacco abuse   Alcohol abuse   Hyponatremia   Hypokalemia  1-AKI: In the setting of bilateral hydroureteronephrosis No prior renal function records Presented with a creatinine of 2.3 Continue with IV fluids Renal ultrasound:Moderate hydronephrosis and bilateral proximal hydroureter. Ureteral jets in the urinary bladder were not identified during a five-minute observation. Continue with IV fluids Urology recommends  continuation of IV antibiotics, IV fluids, follow up out patient.  Cr increase to 2.5, but has good urine out put. No significant residual post void.   2-Anion gap metabolic acidosis: In the setting of AKI.  Continue with IV fluids Started sodium bicarb tablet.   Hypokalemia: Replaced.  Mg supplemented.  Hyponatremia: Resolved Alcohol abuse: Counseling provided CIWA protocol  Tobacco abuse: Continue with nicotine patch  Hydroureteronephrosis: Urology consulted  Constipation; KUB negative. Started on laxative.   Hypertension:  Started  Norvasc.  IV hydralazine PRN   Estimated body mass index is 19.07 kg/m as calculated from the following:   Height as of this encounter: '5\' 6"'$  (1.676 m).   Weight as of this encounter: 53.6 kg.   DVT prophylaxis: Heparin  Code Status: DNR Family Communication: Care discussed with patient  Disposition Plan:  Status is: Inpatient Remains inpatient appropriate because: Remain inpatient for management of hydroureteronephrosis.     Consultants:  Urology  Procedures:  Renal US.   Antimicrobials:    Subjective: Still having back pain, abdominal pain. Vomited yesterday am.  Not feeling well.   Objective: Vitals:   04/10/22 1357 04/10/22 2132 04/11/22 0443 04/11/22 1457  BP: (!) 169/88 (!) 173/88 (!) 165/88 (!) 157/87  Pulse: (!) 102 (!) 102 94 82  Resp: '16 18 18 20  '$ Temp: 98.1 F (36.7 C) 98.1 F (36.7 C) 98.6 F (37 C) 98 F (36.7 C)  TempSrc: Oral Oral Oral Oral  SpO2: 93% 94% 91% 94%  Weight:      Height:        Intake/Output Summary (Last 24 hours) at 04/11/2022 1502 Last data filed at 04/11/2022 0700  Gross per 24 hour  Intake --  Output 1100 ml  Net -1100 ml    F[employer] Weights   04/06/22 1428  Weight: 53.6 kg    Examination:  General exam: NAD Respiratory system: CTA Cardiovascular system: S 1, S 2 RRR Gastrointestinal system: BS present, soft nt Central nervous system: alert  Extremities: no edema  Data  Reviewed: I have personally reviewed following labs and imaging studies  CBC: Recent Labs  Lab 04/06/22 0555 04/07/22 0629 04/08/22 0524 04/09/22 0712 04/10/22 0640 04/11/22 0609  WBC 22.0* 13.4* 13.2* 13.0* 14.0* 13.8*  NEUTROABS 19.0*  --  10.7* 10.0*  --   --   HGB 13.0 12.4 10.5* 11.6* 11.4* 12.0  HCT 37.2 37.1 31.4* 36.1 35.3* 35.8*  MCV 96.4 101.6* 99.7 103.4* 102.6* 98.9  PLT 426* 408* 408* 539* 538* 490*    Basic Metabolic Panel: Recent Labs  Lab 04/07/22 1848 04/08/22 0524 04/09/22 0712 04/10/22 0541 04/10/22 0640 04/11/22 0609  NA 137 137 140 143  --  145  K 3.8 3.5 4.2 3.3*  --  3.5  CL 106 107 108 108  --  106  CO2 21* 19* 17* 19*  --  20*  GLUCOSE 77 74 72 84  --  78  BUN '23 21 18 16  '$ --  14  CREATININE 2.25* 2.30* 2.21* 2.30*  --  2.57*  CALCIUM 8.9 8.8* 9.0 8.9  --  8.9  MG  --  1.4*  --   --  1.8  --   PHOS  --   --   --  5.0*  --   --     GFR: Estimated Creatinine Clearance: 18.7 mL/min (A) (by C-G formula based on SCr of 2.57 mg/dL (H)). Liver Function Tests: Recent Labs  Lab 04/06/22 0555 04/07/22 0629 04/10/22 0541  AST 26 14*  --   ALT 10 9  --   ALKPHOS 78 75  --   BILITOT 1.1 0.4  --   PROT 7.5 7.2  --   ALBUMIN 3.8 3.7 3.1*    Recent Labs  Lab 04/06/22 0555  LIPASE 24    No results for input(s): "AMMONIA" in the last 168 hours. Coagulation Profile: No results for input(s): "INR", "PROTIME" in the last 168 hours. Cardiac Enzymes: No results for input(s): "CKTOTAL", "CKMB", "CKMBINDEX", "TROPONINI" in the last 168 hours. BNP (last 3 results) No results for input(s): "PROBNP" in the last 8760 hours. HbA1C: No results for input(s): "HGBA1C" in the last 72 hours. CBG: No results for input(s): "GLUCAP" in the last 168 hours. Lipid Profile: No results for input(s): "CHOL", "HDL", "LDLCALC", "TRIG", "CHOLHDL", "LDLDIRECT" in the last 72 hours. Thyroid Function Tests: No results for input(s): "TSH", "T4TOTAL", "FREET4",  "T3FREE", "THYROIDAB" in the last 72 hours. Anemia Panel: No results for input(s): "VITAMINB12", "FOLATE", "FERRITIN", "TIBC", "IRON", "RETICCTPCT" in the last 72 hours. Sepsis Labs: No results for input(s): "PROCALCITON", "LATICACIDVEN" in the last 168 hours.  Recent Results (from the past 240 hour(s))  Urine Culture     Status: Abnormal   Collection Time: 04/06/22  8:31 AM   Specimen: Urine, Clean Catch  Result Value Ref Range Status   Specimen Description   Final    URINE, CLEAN CATCH Performed at [facility], [place] [address] [place], [place] [address]    Special Requests   Final    NONE Performed at [facility], [place] [address], [place], [place] [address]    Culture (  A)  Final    <10,000 COLONIES/mL INSIGNIFICANT GROWTH Performed at [facility] [address], [place], [place] [address]    Report Status 04/07/2022 FINAL  Final         Radiology Studies: DG Abd 1 View  Result Date: 04/10/2022 CLINICAL DATA:  Lower right side abdominal pain. EXAM: ABDOMEN - 1 VIEW COMPARISON:  CT abdomen and pelvis 04/06/2022; renal ultrasound 04/10/2022 FINDINGS: Bowel gas is seen within nondistended loops of small and large bowel. Bilateral internal iliac arterial vascular calcifications are again seen. There are numerous vascular phleboliths overlying the pelvis as seen on prior CT. No definitive renal stone is seen. No portal venous gas or pneumatosis. No acute skeletal abnormality. IMPRESSION: 1. Nonobstructed bowel-gas pattern. 2. Note is made of bilateral hydroureteronephrosis on prior CT including diffuse proximal 2/3 of the right ureteral edema and wall thickening. Electronically Signed   By: Yvonne Kendall M.D.   On: 04/10/2022 14:38   US RENAL  Result Date: 04/10/2022 CLINICAL DATA:  Acute kidney injury EXAM: RENAL / URINARY TRACT ULTRASOUND COMPLETE COMPARISON:  None Available. FINDINGS: Right Kidney: Renal measurements: 11.8 by 5.9 by  7.3 cm = volume: 263 mL. Moderate hydronephrosis and proximal hydroureter. Left Kidney: Renal measurements: 11.0 by 6.5 by 5.9 cm = volume: 222 mL. Moderate hydronephrosis and proximal hydroureter. Bladder: Appears normal for degree of bladder distention. Ureteral jets were not observed during a five-minute observation. Other: None. IMPRESSION: 1. Moderate hydronephrosis and bilateral proximal hydroureter. Ureteral jets in the urinary bladder were not identified during a five-minute observation. Electronically Signed   By: Van Clines M.D.   On: 04/10/2022 14:15        Scheduled Meds:  amLODipine  5 mg Oral Daily   docusate sodium  100 mg Oral BID   folic acid  1 mg Oral Daily   heparin  5,000 Units Subcutaneous Q8H   multivitamin with minerals  1 tablet Oral Daily   nicotine  21 mg Transdermal Daily   pantoprazole  40 mg Oral Daily   senna-docusate  1 tablet Oral BID   sodium bicarbonate  650 mg Oral TID   thiamine  100 mg Oral Daily   Or   thiamine  100 mg Intravenous Daily   Continuous Infusions:  cefTRIAXone (ROCEPHIN)  IV 2 g (04/10/22 1653)   lactated ringers 100 mL/hr at 04/10/22 0848     LOS: 5 days    Time spent: 35 minutes    Brenton Joines A Ryin Ambrosius, MD [facility]   If 7PM-7AM, please contact night-coverage [email or web address]  04/11/2022, 3:02 PM

## 2022-04-11 NOTE — Progress Notes (Signed)
Consult for new PIV. Pt. Has had 3 US guided PIVs in 3 days. PIVs not lasting 24hrs. Need provider input for choosing the best vascular access for this patient. CrCl is 18.7. Spoke with RN at length. Will consult [place] again when a plan has been discussed with provider.

## 2022-04-12 ENCOUNTER — Encounter (HOSPITAL_COMMUNITY): Payer: Self-pay | Admitting: Internal Medicine

## 2022-04-12 ENCOUNTER — Inpatient Hospital Stay (HOSPITAL_COMMUNITY): Payer: Medicare Other

## 2022-04-12 DIAGNOSIS — N179 Acute kidney failure, unspecified: Secondary | ICD-10-CM | POA: Diagnosis not present

## 2022-04-12 HISTORY — PX: IR NEPHROSTOMY PLACEMENT RIGHT: IMG6064

## 2022-04-12 LAB — BASIC METABOLIC PANEL
Anion gap: 8 (ref 5–15)
Anion gap: >20 — ABNORMAL HIGH (ref 5–15)
Anion gap: 19 — ABNORMAL HIGH (ref 5–15)
BUN: 9 mg/dL (ref 8–23)
BUN: 14 mg/dL (ref 8–23)
BUN: 11 mg/dL (ref 8–23)
CO2: 19 mmol/L — ABNORMAL LOW (ref 22–32)
CO2: 19 mmol/L — ABNORMAL LOW (ref 22–32)
CO2: 22 mmol/L (ref 22–32)
Calcium: 8.3 mg/dL — ABNORMAL LOW (ref 8.9–10.3)
Calcium: 8.3 mg/dL — ABNORMAL LOW (ref 8.9–10.3)
Calcium: 8.3 mg/dL — ABNORMAL LOW (ref 8.9–10.3)
Chloride: 107 mmol/L (ref 98–111)
Chloride: 101 mmol/L (ref 98–111)
Chloride: 100 mmol/L (ref 98–111)
Creatinine, Ser: 0.47 mg/dL (ref 0.44–1.00)
Creatinine, Ser: 2.29 mg/dL — ABNORMAL HIGH (ref 0.44–1.00)
Creatinine, Ser: 1.44 mg/dL — ABNORMAL HIGH (ref 0.44–1.00)
GFR, Estimated: >60 mL/min (ref >60)
GFR, Estimated: 23 mL/min — ABNORMAL LOW (ref >60)
GFR, Estimated: 41 mL/min — ABNORMAL LOW (ref >60)
Glucose, Bld: 138 mg/dL — ABNORMAL HIGH (ref 70–99)
Glucose, Bld: 78 mg/dL (ref 70–99)
Glucose, Bld: 85 mg/dL (ref 70–99)
Potassium: 3.2 mmol/L — ABNORMAL LOW (ref 3.5–5.1)
Potassium: 2.1 mmol/L — CL (ref 3.5–5.1)
Potassium: 2.6 mmol/L — CL (ref 3.5–5.1)
Sodium: 134 mmol/L — ABNORMAL LOW (ref 135–145)
Sodium: 141 mmol/L (ref 135–145)
Sodium: 141 mmol/L (ref 135–145)

## 2022-04-12 LAB — CBC
HCT: 41 % (ref 36.0–46.0)
Hemoglobin: 13.3 g/dL (ref 12.0–15.0)
MCH: 30.7 pg (ref 26.0–34.0)
MCHC: 32.4 g/dL (ref 30.0–36.0)
MCV: 94.7 fL (ref 80.0–100.0)
Platelets: 281 10*3/uL (ref 150–400)
RBC: 4.33 MIL/uL (ref 3.87–5.11)
RDW: 14.3 % (ref 11.5–15.5)
WBC: 8.4 10*3/uL (ref 4.0–10.5)
nRBC: 0 % (ref 0.0–0.2)

## 2022-04-12 LAB — PROTIME-INR
INR: 1.1 (ref 0.8–1.2)
Prothrombin Time: 14.3 seconds (ref 11.4–15.2)

## 2022-04-12 LAB — POTASSIUM: Potassium: 3.4 mmol/L — ABNORMAL LOW (ref 3.5–5.1)

## 2022-04-12 MED ORDER — CHLORHEXIDINE GLUCONATE CLOTH 2 % EX PADS
6.0000 | MEDICATED_PAD | Freq: Every day | CUTANEOUS | Status: DC
  Administered 2022-04-12 – 2022-04-19 (×8): 6 via TOPICAL

## 2022-04-12 MED ORDER — LIDOCAINE HCL 1 % IJ SOLN
INTRAMUSCULAR | Status: AC
  Filled 2022-04-12: qty 20

## 2022-04-12 MED ORDER — AMLODIPINE BESYLATE 10 MG PO TABS
10.0000 mg | ORAL_TABLET | Freq: Every day | ORAL | Status: DC
  Administered 2022-04-13 – 2022-04-19 (×7): 10 mg via ORAL
  Filled 2022-04-12 (×7): qty 1

## 2022-04-12 MED ORDER — HYDRALAZINE HCL 20 MG/ML IJ SOLN
10.0000 mg | INTRAMUSCULAR | Status: DC | PRN
  Administered 2022-04-13: 10 mg via INTRAVENOUS
  Filled 2022-04-12: qty 1

## 2022-04-12 MED ORDER — FENTANYL CITRATE (PF) 100 MCG/2ML IJ SOLN
INTRAMUSCULAR | Status: AC
  Filled 2022-04-12: qty 2

## 2022-04-12 MED ORDER — MIDAZOLAM HCL 2 MG/2ML IJ SOLN
INTRAMUSCULAR | Status: AC | PRN
  Administered 2022-04-12 (×2): 1 mg via INTRAVENOUS

## 2022-04-12 MED ORDER — IOHEXOL 300 MG/ML  SOLN
50.0000 mL | Freq: Once | INTRAMUSCULAR | Status: AC | PRN
  Administered 2022-04-12: 10 mL

## 2022-04-12 MED ORDER — POTASSIUM CHLORIDE CRYS ER 20 MEQ PO TBCR
20.0000 meq | EXTENDED_RELEASE_TABLET | Freq: Once | ORAL | Status: AC
  Administered 2022-04-12: 20 meq via ORAL
  Filled 2022-04-12: qty 1

## 2022-04-12 MED ORDER — FENTANYL CITRATE (PF) 100 MCG/2ML IJ SOLN
INTRAMUSCULAR | Status: AC | PRN
  Administered 2022-04-12 (×2): 50 ug via INTRAVENOUS

## 2022-04-12 MED ORDER — POTASSIUM CHLORIDE 10 MEQ/100ML IV SOLN
10.0000 meq | INTRAVENOUS | Status: AC
  Administered 2022-04-12 (×4): 10 meq via INTRAVENOUS
  Filled 2022-04-12 (×3): qty 100

## 2022-04-12 MED ORDER — SODIUM CHLORIDE 0.9% FLUSH
5.0000 mL | Freq: Three times a day (TID) | INTRAVENOUS | Status: DC
Start: 2022-04-12 — End: 2022-04-19
  Administered 2022-04-12 – 2022-04-19 (×21): 5 mL

## 2022-04-12 MED ORDER — MIDAZOLAM HCL 2 MG/2ML IJ SOLN
INTRAMUSCULAR | Status: AC
  Filled 2022-04-12: qty 2

## 2022-04-12 NOTE — Procedures (Signed)
Interventional Radiology Procedure Note  Procedure: Right percutaneous nephrostomy  Complications: None  Estimated Blood Loss: < 10 mL  Findings: 10 Fr PCN placed via LP calyceal access. Sample of blood-tinged urine sent for culture. PCN attached to gravity bag drainage.  Betty [name]. Kathlene Cote, M.D Pager:  [phone]

## 2022-04-12 NOTE — Progress Notes (Signed)
PROGRESS NOTE    Betty [name]  JQZ:[id number] DOB: 05-20-1956 DOA: 04/06/2022 PCP: Pcp, No   Brief Narrative: 66 year old with past medical history significant for hypertension, tobacco abuse, EtOH abuse, GERD presenting with right-sided flank pain.  She reports her symptoms began with incontinence 2 months ago.  She had a few episodes and became concerned.  She went to urgent care.  Nothing was found on work-up.  She was sent home with 7 days of antibiotics.  Her symptoms did not improve.  She is also complaining of intermittent right flank pain that got worse over the week.  She presented with worsening symptoms to the ED.  CT abdomen and pelvis: Mild bilateral hydroureteronephrosis.  Both ureters are dilated down to the pelvis just proximal to the UVJ on each side where the ureteral urothelium may hyper enhance.  This is associated with circumferential bladder wall thickening suggestive of cystitis.  Marked: Low-density material measuring 6 mm in thickness surrounding the proximal and right mid ureter.  This probably reflects marked edema of the ureteral wall although contained.  Ureteric edema or lymphoma cannot be excluded.  Initially ED discussed with urology who recommend IV fluids and antibiotics.  Continue to have flank pain, renal function without significant improvement.  Urology has been formally consulted. IV antibiotics resume.     Assessment & Plan:   Principal Problem:   AKI (acute kidney injury) ([place]) Active Problems:   Cystitis   Hydroureteronephrosis   Tobacco abuse   Alcohol abuse   Hyponatremia   Hypokalemia  1-AKI: In the setting of bilateral hydroureteronephrosis No prior renal function records Presented with a creatinine of 2.3 Continue with IV fluids Renal ultrasound:Moderate hydronephrosis and bilateral proximal hydroureter. Ureteral jets in the urinary bladder were not identified during a five-minute observation. Continue with IV fluids Urology recommends  continuation of IV antibiotics, IV fluids, follow up out patient.  Cr increase to 2.5, but has good urine out put. No significant residual post void.  Foley catheter placed. Cr remain 2.2 Underwent right percutaneous nephrostomy tube placement.   2-Anion gap metabolic acidosis: In the setting of AKI.  Continue with IV fluids Continue with  sodium bicarb tablet.   Hypokalemia: replete IV and orally.  Mg supplemented.  Hyponatremia: Resolved Alcohol abuse: Counseling provided CIWA protocol  Tobacco abuse: Continue with nicotine patch  Hydroureteronephrosis: Urology consulted. Underwent right side percutaneous nephrostomy tube.    Constipation; KUB negative. Started on laxative.   Hypertension:  Started  Norvasc. Increase norvasc.  IV hydralazine PRN   Estimated body mass index is 19.07 kg/m as calculated from the following:   Height as of this encounter: '5\' 6"'$  (1.676 m).   Weight as of this encounter: 53.6 kg.   DVT prophylaxis: Heparin  Code Status: DNR Family Communication: Care discussed with patient  Disposition Plan:  Status is: Inpatient Remains inpatient appropriate because: Remain inpatient for management of hydroureteronephrosis.     Consultants:  Urology  Procedures:  Renal US.   Antimicrobials:    Subjective: She is still having flank pain. Awaiting to have nephrostomy tube placement.   Objective: Vitals:   04/12/22 1430 04/12/22 1445 04/12/22 1450 04/12/22 1510  BP: (!) 174/99 (!) 176/94 (!) 169/91 (!) 164/96  Pulse: (!) 101 98 98 97  Resp: '15 15 15 16  '$ Temp:      TempSrc:      SpO2: 96% 100% 100% 97%  Weight:      Height:  Intake/Output Summary (Last 24 hours) at 04/12/2022 1520 Last data filed at 04/12/2022 1100 Gross per 24 hour  Intake --  Output 4350 ml  Net -4350 ml    Filed Weights   04/06/22 1428  Weight: 53.6 kg    Examination:  General exam: NAD Respiratory system: CTA Cardiovascular system: S 1, S 2  RRR Gastrointestinal system: BS present, soft, nt Central nervous system: Alert Extremities: no edema  Data Reviewed: I have personally reviewed following labs and imaging studies  CBC: Recent Labs  Lab 04/06/22 0555 04/07/22 0629 04/08/22 0524 04/09/22 0712 04/10/22 0640 04/11/22 0609 04/12/22 0810  WBC 22.0*   < > 13.2* 13.0* 14.0* 13.8* 8.4  NEUTROABS 19.0*  --  10.7* 10.0*  --   --   --   HGB 13.0   < > 10.5* 11.6* 11.4* 12.0 13.3  HCT 37.2   < > 31.4* 36.1 35.3* 35.8* 41.0  MCV 96.4   < > 99.7 103.4* 102.6* 98.9 94.7  PLT 426*   < > 408* 539* 538* 490* 281   < > = values in this interval not displayed.    Basic Metabolic Panel: Recent Labs  Lab 04/08/22 0524 04/09/22 0712 04/10/22 0541 04/10/22 0640 04/11/22 0609 04/12/22 0810 04/12/22 1044  NA 137 140 143  --  145 134* 141  K 3.5 4.2 3.3*  --  3.5 3.2* 2.1*  CL 107 108 108  --  106 107 101  CO2 19* 17* 19*  --  20* 19* 19*  GLUCOSE 74 72 84  --  78 138* 78  BUN '21 18 16  '$ --  '14 9 14  '$ CREATININE 2.30* 2.21* 2.30*  --  2.57* 0.47 2.29*  CALCIUM 8.8* 9.0 8.9  --  8.9 8.3* 8.3*  MG 1.4*  --   --  1.8  --   --   --   PHOS  --   --  5.0*  --   --   --   --     GFR: Estimated Creatinine Clearance: 21 mL/min (A) (by C-G formula based on SCr of 2.29 mg/dL (H)). Liver Function Tests: Recent Labs  Lab 04/06/22 0555 04/07/22 0629 04/10/22 0541  AST 26 14*  --   ALT 10 9  --   ALKPHOS 78 75  --   BILITOT 1.1 0.4  --   PROT 7.5 7.2  --   ALBUMIN 3.8 3.7 3.1*    Recent Labs  Lab 04/06/22 0555  LIPASE 24    No results for input(s): "AMMONIA" in the last 168 hours. Coagulation Profile: Recent Labs  Lab 04/12/22 0605  INR 1.1   Cardiac Enzymes: No results for input(s): "CKTOTAL", "CKMB", "CKMBINDEX", "TROPONINI" in the last 168 hours. BNP (last 3 results) No results for input(s): "PROBNP" in the last 8760 hours. HbA1C: No results for input(s): "HGBA1C" in the last 72 hours. CBG: No results for  input(s): "GLUCAP" in the last 168 hours. Lipid Profile: No results for input(s): "CHOL", "HDL", "LDLCALC", "TRIG", "CHOLHDL", "LDLDIRECT" in the last 72 hours. Thyroid Function Tests: No results for input(s): "TSH", "T4TOTAL", "FREET4", "T3FREE", "THYROIDAB" in the last 72 hours. Anemia Panel: No results for input(s): "VITAMINB12", "FOLATE", "FERRITIN", "TIBC", "IRON", "RETICCTPCT" in the last 72 hours. Sepsis Labs: No results for input(s): "PROCALCITON", "LATICACIDVEN" in the last 168 hours.  Recent Results (from the past 240 hour(s))  Urine Culture     Status: Abnormal   Collection Time: 04/06/22  8:31 AM  Specimen: Urine, Clean Catch  Result Value Ref Range Status   Specimen Description   Final    URINE, CLEAN CATCH Performed at [facility], [place] [address] [place], [place] [address]    Special Requests   Final    NONE Performed at [facility], [place] [address] [place], [place] [address]    Culture (A)  Final    <10,000 COLONIES/mL INSIGNIFICANT GROWTH Performed at [facility] [address] [place],  [address]    Report Status 04/07/2022 FINAL  Final         Radiology Studies: No results found.      Scheduled Meds:  amLODipine  5 mg Oral Daily   Chlorhexidine Gluconate Cloth  6 each Topical Daily   docusate sodium  100 mg Oral BID   fentaNYL       folic acid  1 mg Oral Daily   lidocaine       midazolam       multivitamin with minerals  1 tablet Oral Daily   nicotine  21 mg Transdermal Daily   pantoprazole  40 mg Oral Daily   potassium chloride  20 mEq Oral Once   senna-docusate  1 tablet Oral BID   sodium bicarbonate  650 mg Oral TID   thiamine  100 mg Oral Daily   Or   thiamine  100 mg Intravenous Daily   Continuous Infusions:  cefTRIAXone (ROCEPHIN)  IV 2 g (04/11/22 1505)   lactated ringers 100 mL/hr at 04/12/22 0948   potassium chloride 10 mEq (04/12/22 1401)     LOS: 6 days    Time  spent: 35 minutes    Adelai Achey A Rendon Howell, MD [facility]   If 7PM-7AM, please contact night-coverage [email or web address]  04/12/2022, 3:20 PM

## 2022-04-12 NOTE — TOC Initial Note (Signed)
Transition of Care [facility]) - Initial/Assessment Note    Patient Details  Name: Betty [name] MRN: [id number] Date of Birth: 02/08/1956  Transition [facility]) CM/SW Contact:    Dessa Phi, RN Phone Number: 04/12/2022, 4:03 PM  Clinical Narrative:  Provided w/pcp listing;also placed on d/c instructions SA resources. No further CM needs.                 Expected Discharge Plan: Home/Self Care Barriers to Discharge: Continued Medical Work up   Patient Goals and CMS Choice Patient states their goals for this hospitalization and ongoing recovery are:: Home CMS Medicare.gov Compare Post Acute Care list provided to:: Patient Choice offered to / list presented to : Patient  Expected Discharge Plan and Services Expected Discharge Plan: Home/Self Care   Discharge Planning Services: CM Consult   Living arrangements for the past 2 months: Single Family Home                                      Prior Living Arrangements/Services Living arrangements for the past 2 months: Single Family Home Lives with:: Self Patient language and need for interpreter reviewed:: Yes Do you feel safe going back to the place where you live?: Yes      Need for Family Participation in Patient Care: Yes (Comment) Care giver support system in place?: Yes (comment)   Criminal Activity/Legal Involvement Pertinent to Current Situation/Hospitalization: No - Comment as needed  Activities of Daily Living Home Assistive Devices/Equipment: Walker (specify type) ADL Screening (condition at time of admission) Patient's cognitive ability adequate to safely complete daily activities?: Yes Is the patient deaf or have difficulty hearing?: No Does the patient have difficulty seeing, even when wearing glasses/contacts?: No Does the patient have difficulty concentrating, remembering, or making decisions?: No Patient able to express need for assistance with ADLs?: Yes Does the patient have difficulty dressing  or bathing?: No Independently performs ADLs?: Yes (appropriate for developmental age) Does the patient have difficulty walking or climbing stairs?: No Weakness of Legs: None Weakness of Arms/Hands: None  Permission Sought/Granted Permission sought to share information with : Case Manager Permission granted to share information with : Yes, Verbal Permission Granted  Share Information with NAME: Case Manager           Emotional Assessment Appearance:: Appears stated age Attitude/Demeanor/Rapport: Gracious Affect (typically observed): Accepting Orientation: : Oriented to Self, Oriented to Place, Oriented to  Time, Oriented to Situation Alcohol / Substance Use: Alcohol Use, Tobacco Use Psych Involvement: No (comment)  Admission diagnosis:  Right flank pain [R10.9] AKI (acute kidney injury) ([place]) [N17.9] Patient Active Problem List   Diagnosis Date Noted   Hypokalemia 04/07/2022   Cystitis 04/06/2022   Hydroureteronephrosis 04/06/2022   AKI (acute kidney injury) ([place]) 04/06/2022   Tobacco abuse 04/06/2022   Alcohol abuse 04/06/2022   History of high blood pressure 04/06/2022   Hyponatremia 04/06/2022   PCP:  Pcp, No Pharmacy:   [id number] [place], [address] AT [facility] OF [place] [address] Phone: [phone] Fax: [phone]  [place] [address] Phone: [phone] Fax: [phone]     Social Determinants of Health (SDOH) Interventions    Readmission Risk Interventions     No data to display

## 2022-04-12 NOTE — Progress Notes (Signed)
  Transition of Care [facility]) Screening Note   Patient Details  Name: Betty [name] Date of Birth: 05/17/1956   Transition of Care [facility]) CM/SW Contact:    Dessa Phi, RN Phone Number: 04/12/2022, 2:41 PM    Transition of Care Department [facility]) has reviewed patient and no TOC needs have been identified at this time. We will continue to monitor patient advancement through interdisciplinary progression rounds. If new patient transition needs arise, please place a TOC consult.

## 2022-04-12 NOTE — Discharge Instructions (Signed)
Outpatient Substance Use Treatment Services   [facility] Outpatient  Chemical Dependence Intensive Outpatient Program [address] [place], [name] [address]  [phone] Private insurance, Medicare A&B, and GCCN   [facility] ([facility])  [address]  [place], [place] [address] [phone] Medicaid, Self Pay   [facility]      [address]  [place], [place] [phone] Medicaid and Private Insurance, Self Pay   [facility] [address]  [place], [place]  [phone] Private Insurance, and Self Pay  [facility]      [address]    [place], [place] [address]  [phone] or [phone] Private Insurance Only                 [facility] [name]  [place], [place] [address] [phone] Medicaid, Medicare, Private Insurance  [facility] [address]  [place], [place] [address] [phone] Services are free or reduced  [facility]  [address] [phone]  Self Pay only, sliding scale  [facility]  [address]  [place], [place] [address] [phone] ([facility]) Self Pay, Medicaid Only   [facility] [address]  [place], [place] [address] [phone] Medicaid, Medicare, Private Insurance                     Adolescent Substance Use Treatment Services    [facility] [address]  [place], [place]  [phone] Self Pay Offer scholarships from the [employer] to help pay for treatment  Website: www.[facility]insightprogram.com  [facility]cent [facility]stance use [facility] Males ages: 12-17 Adolescent Substance use Program Females: 12-17  [place] Office [address]  [place], [place] [address] (ph) [phone]  (fax) [phone]  [place]  Office  [address]  [place], [place] [address] (ph) [phone]  (fax) [phone]  [place] Office [address]  [place], [place] [address] (ph) [phone]  (fax) [phone]  [place] Office [address], [place], [place] [address] (ph) [phone]   (fax) [phone]  Website: [email or web address]://youthhavenservices.com/         [facility] Out[facility] Substance Abuse Intensive Outpatient Program for Adolescents Phone: [phone] Address: [address], [place], [place] Website: [email or web address]    Residential Substance Use Treatment Services   [facility]  [address]  [place], [place] [address]  [phone] or [phone] Detox (Medicare, Medicaid, private insurance, and self pay)  Residential Rehab 14 days (Medicare, Medicaid, private insurance, and self pay)   [facility])  [address] , [place]  [phone]  Female and Female Detox (Self Pay and Medicaid limited availability)  Rehab only Female (Medicaid and self pay only)   [facility]      [address]  [place], [place] [address]  [phone] or [phone] Detox and Residential Treatment Private Insurance Only   [facility]  [address]  [place],  [address]  [phone]  Treatment Only, must make assessment appointment, and must be sober for assessment appointment.  Self Pay Only, Medicare A&B, [place] Medicaid, [place] ID only! *Transportation assistance offered from [employer] [address]  [facility]     [address] [place],  [address] Walk in interviews M-Sat 8-4p No   pending legal charges [phone]     [facility]:  [facility] Referral  [address] [place], [place] [phone] (Self Pay, Medicaid)  [facility] [address] [place], [place] [address] [phone] Detox and Residential Treatment Medicare and Private Insurance  [facility] [address]  [place], [place] [address] 28 Day Women's Facility: [phone] 28 Day Men's Facility: [phone] Long-term Residential Program:  [phone] Males 25 and Over (No Insurance, upfront fee)  [facility]  [address] [place], [place] [address] [phone] Private Insurance with [employer], Private Pay  [facility] [address] [place], [place] [address] Local [phone] Private Insurance Only  [facility] [address]  [place], [place] [address]  [phone] (Males, upfront fee)  [facility] [place] [address]  [place], [address] [phone] Private Insurance      [facility] Locations  [facility]  [address]  [place], [place]  [phone] Christian Based Program for individuals experiencing homelessness Self Pay, No insurance  [facility]  Men's program: [facility] [address]  [place], [place] [address] [phone]  [facility] Women's program: [facility] [address] [place], [place] [address] [phone] Christian Based Program for individuals experiencing homelessness Self Pay, No insurance  [facility] [address]  [place], [place] [address]  [phone] Christian Based Program for individuals experiencing homelessness Self Pay, No insurance  [facility] [address]  [place], [place] [address] [phone] Christian Based Program for individuals experiencing homelessness Self Pay, No insurance  [facility] [address] [place],  [phone] Christian Based Program for males experiencing homelessness Self Pay, No insurance 

## 2022-04-12 NOTE — Consult Note (Signed)
Chief Complaint: Patient was seen in consultation today for right percutaneous nephrostomy Chief Complaint  Patient presents with   Flank Pain    Referring Physician(s): MacDiarmid,S  Supervising Physician: Aletta Edouard  Patient Status: [facility] - In-pt  History of Present Illness: Betty [name] is a 66 y.o. female with past medical history of hypertension, GERD, tobacco/alcohol abuse who was admitted to [facility] on 04/06/2022 with persistent abd/right-sided flank pain.  Patient states her symptoms began with incontinence approximately 2 months ago.  She was seen in local urgent care but nothing was found on work-up and was sent home with 7 days of antibiotic therapy.  Her symptoms did not improve.  Due to worsening flank pain she presented to the ED.  CT abdomen pelvis on 7/22 revealed:   1. Mild bilateral hydroureteronephrosis. Both ureters are dilated down to the pelvis just proximal to the UVJ on each side where the ureteral urothelium may hyper enhance. This is associated with circumferential bladder wall thickening suggesting cystitis. 2. Marked collar of low-density material measuring 6 mm in thickness surrounding the proximal and mid right ureter. This probably reflects marked edema of the ureteral wall although contained periureteric edema or lymphoma cannot be excluded. Underlying transitional cell carcinoma is a consideration. 3. Aortic Atherosclerosis (ICD10-I70.0) and Emphysema   Renal ultrasound revealed moderate hydronephrosis and bilateral proximal hydroureter.  She is afebrile, his labs include normal CBC, normal PT/INR, potassium 3.5, creatinine 2.57 up from 2.3, urine culture with insignificant growth; is currently on IV Rocephin; she has Foley cath draining slightly blood-tinged urine; she was evaluated by urology who now recommends right percutaneous nephrostomy  Past Medical History:  Diagnosis Date   GERD (gastroesophageal reflux disease)     HTN (hypertension)    History reviewed. No pertinent surgical history.      Allergies: Patient has no known allergies.  Medications: Prior to Admission medications   Medication Sig Start Date End Date Taking? Authorizing Provider  acetaminophen (TYLENOL) 500 MG tablet Take 500 mg by mouth every 4 (four) hours as needed (pain).   Yes [provider]  esomeprazole (NEXIUM) 20 MG capsule Take 20 mg by mouth daily at 12 noon.   Yes [provider]  ibuprofen (ADVIL) 200 MG tablet Take 400 mg by mouth every 4 (four) hours as needed (pain).   Yes [provider]  MELATONIN PO Take 2 tablets by mouth at bedtime as needed (sleep).   Yes [provider]  Trolamine Salicylate (ASPERCREME EX) Apply 1 Application topically daily as needed (pain).   Yes [provider]     History reviewed. No pertinent family history.  Social History   Socioeconomic History   Marital status: Divorced    Spouse name: Not on file   Number of children: Not on file   Years of education: Not on file   Highest education level: Not on file  Occupational History   Not on file  Tobacco Use   Smoking status: Every Day    Packs/day: 2.00    Types: Cigarettes   Smokeless tobacco: Not on file  Substance and Sexual Activity   Alcohol use: Not on file   Drug use: Not on file   Sexual activity: Not on file  Other Topics Concern   Not on file  Social History Narrative   Not on file   Social Determinants of Health   Financial Resource Strain: Not on file  Food Insecurity: Not on file  Transportation Needs:  Not on file  Physical Activity: Not on file  Stress: Not on file  Social Connections: Not on file      Review of Systems currently denies fever, headache, chest pain, dyspnea, cough, vomiting; she has had some recent nausea as well as above-noted abdominal/right flank discomfort, worse with deep breathing  Vital Signs: BP (!) 184/94 (BP Location: Right  Arm)   Pulse 86   Temp 98.3 F (36.8 C) (Oral)   Resp 16   Ht '5\' 6"'$  (1.676 m)   Wt 118 lb 2.7 oz (53.6 kg)   SpO2 93%   BMI 19.07 kg/m     Physical Exam awake, alert.  Chest with distant but clear breath sounds bilaterally.  Heart with regular rate and rhythm.  Abdomen soft, few bowel sounds, tender right greater than left upper abdominal region as well as right CVA tenderness  Imaging: DG Abd 1 View  Result Date: 04/10/2022 CLINICAL DATA:  Lower right side abdominal pain. EXAM: ABDOMEN - 1 VIEW COMPARISON:  CT abdomen and pelvis 04/06/2022; renal ultrasound 04/10/2022 FINDINGS: Bowel gas is seen within nondistended loops of small and large bowel. Bilateral internal iliac arterial vascular calcifications are again seen. There are numerous vascular phleboliths overlying the pelvis as seen on prior CT. No definitive renal stone is seen. No portal venous gas or pneumatosis. No acute skeletal abnormality. IMPRESSION: 1. Nonobstructed bowel-gas pattern. 2. Note is made of bilateral hydroureteronephrosis on prior CT including diffuse proximal 2/3 of the right ureteral edema and wall thickening. Electronically Signed   By: Yvonne Kendall M.D.   On: 04/10/2022 14:38   US RENAL  Result Date: 04/10/2022 CLINICAL DATA:  Acute kidney injury EXAM: RENAL / URINARY TRACT ULTRASOUND COMPLETE COMPARISON:  None Available. FINDINGS: Right Kidney: Renal measurements: 11.8 by 5.9 by 7.3 cm = volume: 263 mL. Moderate hydronephrosis and proximal hydroureter. Left Kidney: Renal measurements: 11.0 by 6.5 by 5.9 cm = volume: 222 mL. Moderate hydronephrosis and proximal hydroureter. Bladder: Appears normal for degree of bladder distention. Ureteral jets were not observed during a five-minute observation. Other: None. IMPRESSION: 1. Moderate hydronephrosis and bilateral proximal hydroureter. Ureteral jets in the urinary bladder were not identified during a five-minute observation. Electronically Signed   By: Van Clines M.D.   On: 04/10/2022 14:15   CT Abdomen Pelvis W Contrast  Result Date: 04/06/2022 CLINICAL DATA:  Abdominal pain.  Flank pain. EXAM: CT ABDOMEN AND PELVIS WITH CONTRAST TECHNIQUE: Multidetector CT imaging of the abdomen and pelvis was performed using the standard protocol following bolus administration of intravenous contrast. RADIATION DOSE REDUCTION: This exam was performed according to the departmental dose-optimization program which includes automated exposure control, adjustment of the mA and/or kV according to patient size and/or use of iterative reconstruction technique. CONTRAST:  13m OMNIPAQUE IOHEXOL 300 MG/ML  SOLN COMPARISON:  None Available. FINDINGS: Lower chest: Emphysema. Hepatobiliary: No suspicious focal abnormality within the liver parenchyma. There is no evidence for gallstones, gallbladder wall thickening, or pericholecystic fluid. No intrahepatic or extrahepatic biliary dilation. Pancreas: No focal mass lesion. No dilatation of the main duct. No intraparenchymal cyst. No peripancreatic edema. Spleen: No splenomegaly. No focal mass lesion. Adrenals/Urinary Tract: No adrenal nodule or mass. Mild bilateral hydroureteronephrosis. There is a marked collar of low-density material measuring 6 mm surrounding the proximal and mid right ureter. Distal ureters appear decompressed with prominent enhancement. There is circumferential bladder wall thickening. Stomach/Bowel: Stomach is unremarkable. No gastric wall thickening. No evidence of outlet obstruction. Duodenum is normally  positioned as is the ligament of Treitz. No small bowel wall thickening. No small bowel dilatation. The terminal ileum is normal. The appendix is not well visualized, but there is no edema or inflammation in the region of the cecum. No gross colonic mass. No colonic wall thickening. Vascular/Lymphatic: There is moderate atherosclerotic calcification of the abdominal aorta without aneurysm. There is no  gastrohepatic or hepatoduodenal ligament lymphadenopathy. No retroperitoneal or mesenteric lymphadenopathy. No pelvic sidewall lymphadenopathy. Reproductive: Unremarkable. Other: No intraperitoneal free fluid. Musculoskeletal: No worrisome lytic or sclerotic osseous abnormality. IMPRESSION: 1. Mild bilateral hydroureteronephrosis. Both ureters are dilated down to the pelvis just proximal to the UVJ on each side where the ureteral urothelium may hyper enhance. This is associated with circumferential bladder wall thickening suggesting cystitis. 2. Marked collar of low-density material measuring 6 mm in thickness surrounding the proximal and mid right ureter. This probably reflects marked edema of the ureteral wall although contained periureteric edema or lymphoma cannot be excluded. Underlying transitional cell carcinoma is a consideration. 3. Aortic Atherosclerosis (ICD10-I70.0) and Emphysema (ICD10-J43.9). Electronically Signed   By: Misty Stanley M.D.   On: 04/06/2022 08:00   DG Chest 2 View  Result Date: 04/06/2022 CLINICAL DATA:  Right flank pain for 1 month EXAM: CHEST - 2 VIEW COMPARISON:  None Available. FINDINGS: Artifact from EKG leads. Normal heart size and mediastinal contours. No acute infiltrate or edema. No effusion or pneumothorax. No acute osseous findings. IMPRESSION: No active cardiopulmonary disease. Electronically Signed   By: Jorje Guild M.D.   On: 04/06/2022 07:22    Labs:  CBC: Recent Labs    04/09/22 0712 04/10/22 0640 04/11/22 0609 04/12/22 0810  WBC 13.0* 14.0* 13.8* 8.4  HGB 11.6* 11.4* 12.0 13.3  HCT 36.1 35.3* 35.8* 41.0  PLT 539* 538* 490* 281    COAGS: Recent Labs    04/12/22 0605  INR 1.1    BMP: Recent Labs    04/08/22 0524 04/09/22 0712 04/10/22 0541 04/11/22 0609  NA 137 140 143 145  K 3.5 4.2 3.3* 3.5  CL 107 108 108 106  CO2 19* 17* 19* 20*  GLUCOSE 74 72 84 78  BUN '21 18 16 14  '$ CALCIUM 8.8* 9.0 8.9 8.9  CREATININE 2.30* 2.21* 2.30*  2.57*  GFRNONAA 23* 24* 23* 20*    LIVER FUNCTION TESTS: Recent Labs    04/06/22 0555 04/07/22 0629 04/10/22 0541  BILITOT 1.1 0.4  --   AST 26 14*  --   ALT 10 9  --   ALKPHOS 78 75  --   PROT 7.5 7.2  --   ALBUMIN 3.8 3.7 3.1*    TUMOR MARKERS: No results for input(s): "AFPTM", "CEA", "CA199", "CHROMGRNA" in the last 8760 hours.  Assessment and Plan: 66 y.o. female with past medical history of hypertension, GERD, tobacco/alcohol abuse who was admitted to [facility] on 04/06/2022 with persistent abd/right-sided flank pain.  Patient states her symptoms began with incontinence approximately 2 months ago.  She was seen in local urgent care but nothing was found on work-up and was sent home with 7 days of antibiotic therapy.  Her symptoms did not improve.  Due to worsening flank pain she presented to the ED.  CT abdomen pelvis on 7/22 revealed:   1. Mild bilateral hydroureteronephrosis. Both ureters are dilated down to the pelvis just proximal to the UVJ on each side where the ureteral urothelium may hyper enhance. This is associated with circumferential bladder wall thickening suggesting cystitis. 2.  Marked collar of low-density material measuring 6 mm in thickness surrounding the proximal and mid right ureter. This probably reflects marked edema of the ureteral wall although contained periureteric edema or lymphoma cannot be excluded. Underlying transitional cell carcinoma is a consideration. 3. Aortic Atherosclerosis (ICD10-I70.0) and Emphysema   Renal ultrasound revealed moderate hydronephrosis and bilateral proximal hydroureter.  She is afebrile, his labs include normal CBC, normal PT/INR, potassium 3.5, creatinine 2.57 up from 2.3, urine culture with insignificant growth; is currently on IV Rocephin; she has Foley cath draining slightly blood-tinged urine; she was evaluated by urology who now recommends right percutaneous nephrostomy.  Imaging studies have been  reviewed by Dr. Kathlene Cote.Risks and benefits of right PCN placement was discussed with the patient including, but not limited to, infection, bleeding, significant bleeding causing loss or decrease in renal function or damage to adjacent structures.   All of the patient's questions were answered, patient is agreeable to proceed.  Consent signed and in chart.  Procedure scheduled for today.      Thank you for this interesting consult.  I greatly enjoyed meeting Taraoluwa A Pineau and look forward to participating in their care.  A copy of this report was sent to the requesting provider on this date.  Electronically Signed: D. Rowe Robert, PA-C 04/12/2022, 8:50 AM   I spent a total of  25 minutes   in face to face in clinical consultation, greater than 50% of which was counseling/coordinating care for right percutaneous nephrostomy

## 2022-04-12 NOTE — Care Management Important Message (Signed)
Important Message  Patient Details IM Letter given to the Patient. Name: Betty [name] MRN: [id number] Date of Birth: 1955/12/09   Medicare Important Message Given:  Yes     Kerin Salen 04/12/2022, 10:20 AM

## 2022-04-12 NOTE — Progress Notes (Signed)
Nurse tech noted pink urine from patients foley. Provider notified. Suspected pink urine from multiple foley attempts. Will continue to monitor.

## 2022-04-12 NOTE — Consult Note (Signed)
Urology Consult  Referring physician: Rudolpho Sevin Reason for referral: Hydronephrosis  Chief Complaint: hydronephrosis  Interval 04/12/22: No acute events overnight. Foley draining clear light pink urine. Discussed plan with patient again, she is in agreement with nephrostomy tube placement today.  History of Present Illness: The patient was admitted on July 23 with 58-monthhistory of worsening right-sided abdominal and back pain.  It has been significant in severity.  She has had some nausea with a little bit of vomiting.  No cystitis symptoms.  She initially had an elevated white blood count of 22.  She had a low-grade temperature of 99.7.  Serum creatinine was 2.39.  There was no previous serum creatinine.  The hospital service has been talking to urology for a number of days  Urine culture negative.  Serum creatinine 2.3 for the last 3 days.  Patient had a CT scan.  She had mild bilateral hydronephrosis.  There was a collar of low density material measuring 6 mm in the proximal and mid right ureter.  Both ureters were dilated down to the pelvis just proximal to the ureterovesical junction where the urothelium was hyper enhance.  There was circumferential bladder wall thickening.  It was thought that the color of tissue probably was edema of the ureteral wall.  Lymphoma could not be excluded underlying transitional cell carcinoma a consideration.  The patient normally voids every 2 hours gets up 3 times a night.  No significant history of bladder infections.  No previous bladder surgery or stones  In the last month she started leaking at night while she was asleep.  She had little bit her urgency during the day  `    Past Medical History:  Diagnosis Date   GERD (gastroesophageal reflux disease)    HTN (hypertension)    History reviewed. No pertinent surgical history.  Medications: I have reviewed the patient's current medications. Allergies: No Known Allergies  History reviewed. No  pertinent family history. Social History:  reports that she has been smoking cigarettes. She has been smoking an average of 2 packs per day. She does not have any smokeless tobacco history on file. No history on file for alcohol use and drug use.  ROS: All systems are reviewed and negative except as noted. Rest negative  Physical Exam:  Vital signs in last 24 hours: Temp:  [98 F (36.7 C)-98.3 F (36.8 C)] 98.3 F (36.8 C) (07/28 07253 Pulse Rate:  [82-87] 86 (07/28 0633) Resp:  [14-20] 16 (07/28 06644 BP: (157-184)/(87-97) 184/94 (07/28 00347 SpO2:  [92 %-94 %] 93 % (07/28 04259  Cardiovascular: Skin warm; not flushed Respiratory: Breaths quiet; no shortness of breath Abdomen: No masses Neurological: Normal sensation to touch Musculoskeletal: Normal motor function arms and legs Lymphatics: No inguinal adenopathy Skin: No rashes Genitourinary: Mild tenderness right abdomen and right flank, foley draining clear pink urine  Laboratory Data:  Results for orders placed or performed during the hospital encounter of 04/06/22 (from the past 72 hour(s))  Renal function panel     Status: Abnormal   Collection Time: 04/10/22  5:41 AM  Result Value Ref Range   Sodium 143 135 - 145 mmol/L   Potassium 3.3 (L) 3.5 - 5.1 mmol/L    Comment: DELTA CHECK NOTED   Chloride 108 98 - 111 mmol/L   CO2 19 (L) 22 - 32 mmol/L   Glucose, Bld 84 70 - 99 mg/dL    Comment: Glucose reference range applies only to samples taken after fasting for at  least 8 hours.   BUN 16 8 - 23 mg/dL   Creatinine, Ser 2.30 (H) 0.44 - 1.00 mg/dL   Calcium 8.9 8.9 - 10.3 mg/dL   Phosphorus 5.0 (H) 2.5 - 4.6 mg/dL   Albumin 3.1 (L) 3.5 - 5.0 g/dL   GFR, Estimated 23 (L) >60 mL/min    Comment: (NOTE) Calculated using the CKD-EPI Creatinine Equation (2021)    Anion gap 16 (H) 5 - 15    Comment: Performed at [facility], [place] [address], [place], [place] [address]  CBC     Status: Abnormal    Collection Time: 04/10/22  6:40 AM  Result Value Ref Range   WBC 14.0 (H) 4.0 - 10.5 K/uL   RBC 3.44 (L) 3.87 - 5.11 MIL/uL   Hemoglobin 11.4 (L) 12.0 - 15.0 g/dL   HCT 35.3 (L) 36.0 - 46.0 %   MCV 102.6 (H) 80.0 - 100.0 fL   MCH 33.1 26.0 - 34.0 pg   MCHC 32.3 30.0 - 36.0 g/dL   RDW 14.2 11.5 - 15.5 %   Platelets 538 (H) 150 - 400 K/uL   nRBC 0.0 0.0 - 0.2 %    Comment: Performed at [facility], [place] [address], [place], [place] [address]  Magnesium     Status: None   Collection Time: 04/10/22  6:40 AM  Result Value Ref Range   Magnesium 1.8 1.7 - 2.4 mg/dL    Comment: Performed at [facility], [place] [address] [place], [place] [address]  CBC     Status: Abnormal   Collection Time: 04/11/22  6:09 AM  Result Value Ref Range   WBC 13.8 (H) 4.0 - 10.5 K/uL   RBC 3.62 (L) 3.87 - 5.11 MIL/uL   Hemoglobin 12.0 12.0 - 15.0 g/dL   HCT 35.8 (L) 36.0 - 46.0 %   MCV 98.9 80.0 - 100.0 fL   MCH 33.1 26.0 - 34.0 pg   MCHC 33.5 30.0 - 36.0 g/dL   RDW 14.2 11.5 - 15.5 %   Platelets 490 (H) 150 - 400 K/uL   nRBC 0.0 0.0 - 0.2 %    Comment: Performed at [facility], [place] [address], [place], [place] [address]  Basic metabolic panel     Status: Abnormal   Collection Time: 04/11/22  6:09 AM  Result Value Ref Range   Sodium 145 135 - 145 mmol/L   Potassium 3.5 3.5 - 5.1 mmol/L    Comment: MODERATE HEMOLYSIS   Chloride 106 98 - 111 mmol/L   CO2 20 (L) 22 - 32 mmol/L   Glucose, Bld 78 70 - 99 mg/dL    Comment: Glucose reference range applies only to samples taken after fasting for at least 8 hours.   BUN 14 8 - 23 mg/dL   Creatinine, Ser 2.57 (H) 0.44 - 1.00 mg/dL   Calcium 8.9 8.9 - 10.3 mg/dL   GFR, Estimated 20 (L) >60 mL/min    Comment: (NOTE) Calculated using the CKD-EPI Creatinine Equation (2021)    Anion gap 19 (H) 5 - 15    Comment: Performed at [facility], [place] [address], [place], [place] [address]    Recent Results (from the past 240 hour(s))  Urine Culture     Status: Abnormal   Collection Time: 04/06/22  8:31 AM   Specimen: Urine, Clean Catch  Result Value Ref Range Status   Specimen Description   Final    URINE,  CLEAN CATCH Performed at [facility], [place] [address] [place], [place] [address]    Special Requests   Final    NONE Performed at [facility], [place] [address] [place], [place] [address]    Culture (A)  Final    <10,000 COLONIES/mL INSIGNIFICANT GROWTH Performed at [place] [address] [place], [place] [address]    Report Status 04/07/2022 FINAL  Final   Creatinine: Recent Labs    04/06/22 0555 04/07/22 0629 04/07/22 1848 04/08/22 0524 04/09/22 0712 04/10/22 0541 04/11/22 0609  CREATININE 2.39* 2.32* 2.25* 2.30* 2.21* 2.30* 2.57*     Xrays: See report/chart Reviewed  Impression/Assessment:  Patient has bilateral hydronephrosis and very impressive findings in the upper two thirds of the right ureter.  I went over the x-ray with radiology.  She has hyperenhancement of the urothelium of the ureter but a thickened wall likely edema.  Plan:  - Right nephrostomy tube today - continue foley catheter - Please keep NPO, can eat following procedure - continue abx   Florentina Addison 04/12/2022, 7:19 AM

## 2022-04-12 NOTE — Plan of Care (Signed)

## 2022-04-13 ENCOUNTER — Inpatient Hospital Stay (HOSPITAL_COMMUNITY): Payer: Medicare Other

## 2022-04-13 DIAGNOSIS — N179 Acute kidney failure, unspecified: Secondary | ICD-10-CM | POA: Diagnosis not present

## 2022-04-13 LAB — CBC
HCT: 33.2 % — ABNORMAL LOW (ref 36.0–46.0)
Hemoglobin: 11.5 g/dL — ABNORMAL LOW (ref 12.0–15.0)
MCH: 33.3 pg (ref 26.0–34.0)
MCHC: 34.6 g/dL (ref 30.0–36.0)
MCV: 96.2 fL (ref 80.0–100.0)
Platelets: 499 10*3/uL — ABNORMAL HIGH (ref 150–400)
RBC: 3.45 MIL/uL — ABNORMAL LOW (ref 3.87–5.11)
RDW: 14.3 % (ref 11.5–15.5)
WBC: 15.3 10*3/uL — ABNORMAL HIGH (ref 4.0–10.5)
nRBC: 0 % (ref 0.0–0.2)

## 2022-04-13 LAB — BASIC METABOLIC PANEL
Anion gap: 19 — ABNORMAL HIGH (ref 5–15)
Anion gap: 15 (ref 5–15)
BUN: 8 mg/dL (ref 8–23)
BUN: 6 mg/dL — ABNORMAL LOW (ref 8–23)
CO2: 24 mmol/L (ref 22–32)
CO2: 22 mmol/L (ref 22–32)
Calcium: 8.2 mg/dL — ABNORMAL LOW (ref 8.9–10.3)
Calcium: 8.2 mg/dL — ABNORMAL LOW (ref 8.9–10.3)
Chloride: 97 mmol/L — ABNORMAL LOW (ref 98–111)
Chloride: 102 mmol/L (ref 98–111)
Creatinine, Ser: 0.98 mg/dL (ref 0.44–1.00)
Creatinine, Ser: 0.87 mg/dL (ref 0.44–1.00)
GFR, Estimated: >60 mL/min (ref >60)
GFR, Estimated: >60 mL/min (ref >60)
Glucose, Bld: 96 mg/dL (ref 70–99)
Glucose, Bld: 96 mg/dL (ref 70–99)
Potassium: 2.1 mmol/L — CL (ref 3.5–5.1)
Potassium: 3.5 mmol/L (ref 3.5–5.1)
Sodium: 140 mmol/L (ref 135–145)
Sodium: 139 mmol/L (ref 135–145)

## 2022-04-13 LAB — URINE CULTURE: Culture: NO GROWTH

## 2022-04-13 LAB — MAGNESIUM: Magnesium: 1.2 mg/dL — ABNORMAL LOW (ref 1.7–2.4)

## 2022-04-13 MED ORDER — HYDROXYZINE HCL 10 MG PO TABS
10.0000 mg | ORAL_TABLET | Freq: Three times a day (TID) | ORAL | Status: DC | PRN
  Administered 2022-04-13 – 2022-04-17 (×8): 10 mg via ORAL
  Filled 2022-04-13 (×10): qty 1

## 2022-04-13 MED ORDER — MAGNESIUM SULFATE 2 GM/50ML IV SOLN
2.0000 g | Freq: Once | INTRAVENOUS | Status: AC
Start: 2022-04-13 — End: 2022-04-13
  Administered 2022-04-13: 2 g via INTRAVENOUS
  Filled 2022-04-13: qty 50

## 2022-04-13 MED ORDER — POTASSIUM CHLORIDE 10 MEQ/100ML IV SOLN
10.0000 meq | INTRAVENOUS | Status: AC
  Administered 2022-04-13 (×4): 10 meq via INTRAVENOUS
  Filled 2022-04-13 (×4): qty 100

## 2022-04-13 MED ORDER — POTASSIUM CHLORIDE 20 MEQ PO PACK
40.0000 meq | PACK | ORAL | Status: AC
  Administered 2022-04-13 (×2): 40 meq via ORAL
  Filled 2022-04-13 (×2): qty 2

## 2022-04-13 MED ORDER — POTASSIUM CHLORIDE CRYS ER 20 MEQ PO TBCR
40.0000 meq | EXTENDED_RELEASE_TABLET | Freq: Once | ORAL | Status: AC
  Administered 2022-04-13: 40 meq via ORAL
  Filled 2022-04-13: qty 2

## 2022-04-13 NOTE — Progress Notes (Signed)
PROGRESS NOTE    Betty [name]  EPP:[id number] DOB: 1956/09/06 DOA: 04/06/2022 PCP: Pcp, No   Brief Narrative: 66 year old with past medical history significant for hypertension, tobacco abuse, EtOH abuse, GERD presenting with right-sided flank pain.  She reports her symptoms began with incontinence 2 months ago.  She had a few episodes and became concerned.  She went to urgent care.  Nothing was found on work-up.  She was sent home with 7 days of antibiotics.  Her symptoms did not improve.  She is also complaining of intermittent right flank pain that got worse over the week.  She presented with worsening symptoms to the ED.  CT abdomen and pelvis: Mild bilateral hydroureteronephrosis.  Both ureters are dilated down to the pelvis just proximal to the UVJ on each side where the ureteral urothelium may hyper enhance.  This is associated with circumferential bladder wall thickening suggestive of cystitis.  Marked: Low-density material measuring 6 mm in thickness surrounding the proximal and right mid ureter.  This probably reflects marked edema of the ureteral wall although contained.  Ureteric edema or lymphoma cannot be excluded.  Initially ED discussed with urology who recommend IV fluids and antibiotics.  Continue to have flank pain, renal function without significant improvement.  Urology has been formally consulted. IV antibiotics resume. Underwent Nephrostomy tube placement 7/28. Renal function improving.     Assessment & Plan:   Principal Problem:   AKI (acute kidney injury) ([place]) Active Problems:   Cystitis   Hydroureteronephrosis   Tobacco abuse   Alcohol abuse   Hyponatremia   Hypokalemia  1-AKI: In the setting of bilateral hydroureteronephrosis -No prior renal function records. Presented with a creatinine of 2.3 -Treated  with IV fluids. -Renal ultrasound: Moderate hydronephrosis and bilateral proximal hydroureter. Ureteral jets in the urinary bladder were not identified  during a five-minute observation. -Urology recommends continuation of IV antibiotics, IV fluids, follow up out patient. Subsequently urology recommended right side nephrostomy tube placement. Foley catheter placed 7/27. Underwent right percutaneous nephrostomy tube placement by IR 7/28. Renal function improving.   2-Anion gap metabolic acidosis: In the setting of AKI.  Continue with IV fluids Continue with  sodium bicarb tablet.   Hypokalemia: replete IV and orally.  Hypomagnesemia; replete IV.  Hyponatremia: Resolved Alcohol abuse: Counseling provided CIWA protocol  Tobacco abuse: Continue with nicotine patch  Hydroureteronephrosis: Urology consulted. Underwent right side percutaneous nephrostomy tube.    Constipation; KUB negative. Started on laxative.  Continue to have abdominal pain. Will repeat KUB.   Hypertension:  Started  Norvasc. Increase norvasc.  IV hydralazine PRN   Estimated body mass index is 19.07 kg/m as calculated from the following:   Height as of this encounter: '5\' 6"'$  (1.676 m).   Weight as of this encounter: 53.6 kg.   DVT prophylaxis: Heparin  Code Status: DNR Family Communication: Care discussed with patient  Disposition Plan:  Status is: Inpatient Remains inpatient appropriate because: Remain inpatient for management of hydroureteronephrosis.     Consultants:  Urology  Procedures:  Renal US.   Antimicrobials:    Subjective: Report feeling some improvement after nephrostomy tube placement.  But still having flank pain and supra-pubic pain. Required IV pain meds.  Had BM but was small and watery.   Objective: Vitals:   04/12/22 1510 04/12/22 2010 04/12/22 2302 04/13/22 0333  BP: (!) 164/96 (!) 156/87 (!) 170/80 (!) 162/90  Pulse: 97 91 84 93  Resp: 16  14   Temp:   98.4 F (  36.9 C) 98.2 F (36.8 C)  TempSrc:   Oral Oral  SpO2: 97%  93% 94%  Weight:      Height:        Intake/Output Summary (Last 24 hours) at 04/13/2022  1355 Last data filed at 04/13/2022 1321 Gross per 24 hour  Intake 2972.23 ml  Output 5725 ml  Net -2752.77 ml    Filed Weights   04/06/22 1428  Weight: 53.6 kg    Examination:  General exam: NAD Respiratory system: CTA Cardiovascular system: S 1 , S 2 RRR Gastrointestinal system: BS present, soft, tender lower quadrant. Nephrostomy tube in place.  Central nervous system: alert, conversant.  Extremities: no edema  Data Reviewed: I have personally reviewed following labs and imaging studies  CBC: Recent Labs  Lab 04/08/22 0524 04/09/22 0712 04/10/22 0640 04/11/22 0609 04/12/22 0810 04/13/22 0618  WBC 13.2* 13.0* 14.0* 13.8* 8.4 15.3*  NEUTROABS 10.7* 10.0*  --   --   --   --   HGB 10.5* 11.6* 11.4* 12.0 13.3 11.5*  HCT 31.4* 36.1 35.3* 35.8* 41.0 33.2*  MCV 99.7 103.4* 102.6* 98.9 94.7 96.2  PLT 408* 539* 538* 490* 281 499*    Basic Metabolic Panel: Recent Labs  Lab 04/08/22 0524 04/09/22 0712 04/10/22 0541 04/10/22 0640 04/11/22 0609 04/12/22 0810 04/12/22 1044 04/12/22 2014 04/12/22 2259 04/13/22 0618  NA 137   < > 143  --  145 134* 141 141  --  140  K 3.5   < > 3.3*  --  3.5 3.2* 2.1* 2.6* 3.4* 2.1*  CL 107   < > 108  --  106 107 101 100  --  97*  CO2 19*   < > 19*  --  20* 19* 19* 22  --  24  GLUCOSE 74   < > 84  --  78 138* 78 85  --  96  BUN 21   < > 16  --  '14 9 14 11  '$ --  8  CREATININE 2.30*   < > 2.30*  --  2.57* 0.47 2.29* 1.44*  --  0.98  CALCIUM 8.8*   < > 8.9  --  8.9 8.3* 8.3* 8.3*  --  8.2*  MG 1.4*  --   --  1.8  --   --   --   --   --  1.2*  PHOS  --   --  5.0*  --   --   --   --   --   --   --    < > = values in this interval not displayed.    GFR: Estimated Creatinine Clearance: 49.1 mL/min (by C-G formula based on SCr of 0.98 mg/dL). Liver Function Tests: Recent Labs  Lab 04/07/22 0629 04/10/22 0541  AST 14*  --   ALT 9  --   ALKPHOS 75  --   BILITOT 0.4  --   PROT 7.2  --   ALBUMIN 3.7 3.1*    No results for input(s):  "LIPASE", "AMYLASE" in the last 168 hours.  No results for input(s): "AMMONIA" in the last 168 hours. Coagulation Profile: Recent Labs  Lab 04/12/22 0605  INR 1.1    Cardiac Enzymes: No results for input(s): "CKTOTAL", "CKMB", "CKMBINDEX", "TROPONINI" in the last 168 hours. BNP (last 3 results) No results for input(s): "PROBNP" in the last 8760 hours. HbA1C: No results for input(s): "HGBA1C" in the last 72 hours. CBG: No results for input(s): "GLUCAP"  in the last 168 hours. Lipid Profile: No results for input(s): "CHOL", "HDL", "LDLCALC", "TRIG", "CHOLHDL", "LDLDIRECT" in the last 72 hours. Thyroid Function Tests: No results for input(s): "TSH", "T4TOTAL", "FREET4", "T3FREE", "THYROIDAB" in the last 72 hours. Anemia Panel: No results for input(s): "VITAMINB12", "FOLATE", "FERRITIN", "TIBC", "IRON", "RETICCTPCT" in the last 72 hours. Sepsis Labs: No results for input(s): "PROCALCITON", "LATICACIDVEN" in the last 168 hours.  Recent Results (from the past 240 hour(s))  Urine Culture     Status: Abnormal   Collection Time: 04/06/22  8:31 AM   Specimen: Urine, Clean Catch  Result Value Ref Range Status   Specimen Description   Final    URINE, CLEAN CATCH Performed at [facility], [place] [address] [place], [place] [address]    Special Requests   Final    NONE Performed at [facility], [place] [address], [place], [place] [address]    Culture (A)  Final    <10,000 COLONIES/mL INSIGNIFICANT GROWTH Performed at [facility] [address], [place], [place] [address]    Report Status 04/07/2022 FINAL  Final  Urine Culture     Status: None   Collection Time: 04/12/22  3:01 PM   Specimen: Urine, Random  Result Value Ref Range Status   Specimen Description   Final    URINE, RANDOM Performed at [facility] [address], [place], [place] [address]    Special Requests   Final    RIGHT KIDNEY Performed at [facility], [place] [address], [place], [place] [address]    Culture   Final    NO GROWTH Performed at [facility], [place] [address], [place], [place] [address]    Report Status 04/13/2022 FINAL  Final         Radiology Studies: IR NEPHROSTOMY PLACEMENT RIGHT  Result Date: 04/12/2022 CLINICAL DATA:  Bilateral hydronephrosis, right greater than left. Acute kidney injury and urinary infection. Request for right percutaneous nephrostomy tube. EXAM: 1. ULTRASOUND GUIDANCE FOR PUNCTURE OF THE RIGHT RENAL COLLECTING SYSTEM. 2. Right PERCUTANEOUS NEPHROSTOMY TUBE PLACEMENT. COMPARISON:  Renal ultrasound on 04/10/2022 and CT of the abdomen and pelvis on 04/06/2022. ANESTHESIA/SEDATION: Moderate (conscious) sedation was employed during this procedure. A total of Versed 2.0 mg and Fentanyl 100 mcg was administered intravenously by radiology nursing. Moderate Sedation Time: 14 minutes. The patient's level of consciousness and vital signs were monitored continuously by radiology nursing throughout the procedure under my direct supervision. CONTRAST:  Ten ml Omnipaque 300 MEDICATIONS: No additional medications. FLUOROSCOPY TIME:  3.0 mGy. PROCEDURE: The procedure, risks, benefits, and alternatives were explained to the patient. Questions regarding the procedure were encouraged and answered. The patient understands and consents to the procedure. A time-out was performed prior to initiating the procedure. The right flank region was prepped with chlorhexidine in a sterile fashion, and a sterile drape was applied covering the operative field. A sterile gown and sterile gloves were used for the procedure. Local anesthesia was provided with 1% Lidocaine. Ultrasound was used to localize the right kidney. Under direct ultrasound guidance, a 21 gauge needle was advanced into the renal collecting system. Ultrasound image documentation was performed. Aspiration of urine sample was performed followed by  contrast injection. A transitional dilator was advanced over a guidewire. Percutaneous tract dilatation was then performed over the guidewire. A 10-French percutaneous nephrostomy tube was then advanced and formed in the collecting system. Catheter position was confirmed by fluoroscopy after contrast  injection. The catheter was attached to a gravity drainage bag and secured at the skin with a Prolene retention suture. COMPLICATIONS: None. FINDINGS: Ultrasound demonstrates moderate hydronephrosis of the right kidney. A nephrostomy tube was placed via lower pole caliceal access and formed at the level of the renal pelvis. There is return of blood tinged urine. A urine sample was sent for culture analysis. IMPRESSION: Right percutaneous nephrostomy tube placement. A 10 French catheter was placed and formed at the level of the renal pelvis. This was attached to gravity bag drainage. A urine sample was sent for culture analysis. Electronically Signed   By: Aletta Edouard M.D.   On: 04/12/2022 15:27        Scheduled Meds:  amLODipine  10 mg Oral Daily   Chlorhexidine Gluconate Cloth  6 each Topical Daily   docusate sodium  100 mg Oral BID   folic acid  1 mg Oral Daily   multivitamin with minerals  1 tablet Oral Daily   nicotine  21 mg Transdermal Daily   pantoprazole  40 mg Oral Daily   senna-docusate  1 tablet Oral BID   sodium bicarbonate  650 mg Oral TID   sodium chloride flush  5 mL Intracatheter Q8H   thiamine  100 mg Oral Daily   Or   thiamine  100 mg Intravenous Daily   Continuous Infusions:  cefTRIAXone (ROCEPHIN)  IV Stopped (04/12/22 1703)   lactated ringers 100 mL/hr at 04/13/22 0549     LOS: 7 days    Time spent: 35 minutes    Elenora Hawbaker A Fidelis Loth, MD [facility]   If 7PM-7AM, please contact night-coverage [email or web address]  04/13/2022, 1:55 PM

## 2022-04-13 NOTE — Progress Notes (Signed)
After her neph tube was placed her kidney function is improving quickly.  Her pain is improved, but she still is having pain across her abdomen.  Her neph tube is draining clear urine and the insertion site is c/d/I.   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  I don't recommend any additional treatment or intervention at this time.    She will be scheduled for f/u with Dr. Tresa Moore as an outpatient.

## 2022-04-13 NOTE — Progress Notes (Addendum)
Date and time results received: 04/13/22 0736   Test: potassium  Critical Value: 2.1  Name of Provider Notified: [place]  Orders Received? Or Actions Taken?: Actions Taken: Waiting for provider response.

## 2022-04-13 NOTE — Progress Notes (Signed)
PT Cancellation Note  Patient Details Name: Betty [name] MRN: [id number] DOB: November 12, 1955   Cancelled Treatment:    Reason Eval/Treat Not Completed: Medical issues which prohibited therapy. Pt with critical Potassium value of 2.1.Per department protocol will hold off on PT at the moment. Will check back later as schedule permits.   Galen Manila 04/13/2022, 9:56 AM

## 2022-04-14 ENCOUNTER — Inpatient Hospital Stay (HOSPITAL_COMMUNITY): Payer: Medicare Other

## 2022-04-14 DIAGNOSIS — N179 Acute kidney failure, unspecified: Secondary | ICD-10-CM | POA: Diagnosis not present

## 2022-04-14 LAB — BASIC METABOLIC PANEL
Anion gap: 15 (ref 5–15)
BUN: 5 mg/dL — ABNORMAL LOW (ref 8–23)
CO2: 27 mmol/L (ref 22–32)
Calcium: 7.8 mg/dL — ABNORMAL LOW (ref 8.9–10.3)
Chloride: 94 mmol/L — ABNORMAL LOW (ref 98–111)
Creatinine, Ser: 0.66 mg/dL (ref 0.44–1.00)
GFR, Estimated: >60 mL/min (ref >60)
Glucose, Bld: 90 mg/dL (ref 70–99)
Potassium: 2.6 mmol/L — CL (ref 3.5–5.1)
Sodium: 136 mmol/L (ref 135–145)

## 2022-04-14 LAB — HEPATIC FUNCTION PANEL
ALT: 10 U/L (ref 0–44)
AST: 16 U/L (ref 15–41)
Albumin: 3.2 g/dL — ABNORMAL LOW (ref 3.5–5.0)
Alkaline Phosphatase: 59 U/L (ref 38–126)
Bilirubin, Direct: 0.1 mg/dL (ref 0.0–0.2)
Indirect Bilirubin: 0.8 mg/dL (ref 0.3–0.9)
Total Bilirubin: 0.9 mg/dL (ref 0.3–1.2)
Total Protein: 6.6 g/dL (ref 6.5–8.1)

## 2022-04-14 LAB — CBC
HCT: 34.4 % — ABNORMAL LOW (ref 36.0–46.0)
Hemoglobin: 11.6 g/dL — ABNORMAL LOW (ref 12.0–15.0)
MCH: 32.9 pg (ref 26.0–34.0)
MCHC: 33.7 g/dL (ref 30.0–36.0)
MCV: 97.5 fL (ref 80.0–100.0)
Platelets: 440 10*3/uL — ABNORMAL HIGH (ref 150–400)
RBC: 3.53 MIL/uL — ABNORMAL LOW (ref 3.87–5.11)
RDW: 14.3 % (ref 11.5–15.5)
WBC: 14.4 10*3/uL — ABNORMAL HIGH (ref 4.0–10.5)
nRBC: 0 % (ref 0.0–0.2)

## 2022-04-14 LAB — MAGNESIUM: Magnesium: 1.3 mg/dL — ABNORMAL LOW (ref 1.7–2.4)

## 2022-04-14 LAB — POTASSIUM: Potassium: 3.9 mmol/L (ref 3.5–5.1)

## 2022-04-14 MED ORDER — POTASSIUM CHLORIDE 20 MEQ PO PACK
40.0000 meq | PACK | Freq: Once | ORAL | Status: AC
  Administered 2022-04-14: 40 meq via ORAL
  Filled 2022-04-14: qty 2

## 2022-04-14 MED ORDER — MAGNESIUM SULFATE 2 GM/50ML IV SOLN
2.0000 g | Freq: Once | INTRAVENOUS | Status: AC
Start: 2022-04-14 — End: 2022-04-15
  Administered 2022-04-14: 2 g via INTRAVENOUS
  Filled 2022-04-14: qty 50

## 2022-04-14 MED ORDER — CALCIUM CARBONATE 1250 (500 CA) MG PO TABS
1.0000 | ORAL_TABLET | Freq: Two times a day (BID) | ORAL | Status: DC
  Administered 2022-04-14 – 2022-04-19 (×7): 1250 mg via ORAL
  Filled 2022-04-14 (×8): qty 1

## 2022-04-14 MED ORDER — METHOCARBAMOL 500 MG PO TABS
500.0000 mg | ORAL_TABLET | Freq: Four times a day (QID) | ORAL | Status: DC | PRN
  Administered 2022-04-14 – 2022-04-18 (×8): 500 mg via ORAL
  Filled 2022-04-14 (×8): qty 1

## 2022-04-14 MED ORDER — POTASSIUM CHLORIDE CRYS ER 20 MEQ PO TBCR: 40.0000 meq | EXTENDED_RELEASE_TABLET | ORAL | Status: DC

## 2022-04-14 MED ORDER — HYDROMORPHONE HCL 1 MG/ML IJ SOLN
0.5000 mg | INTRAMUSCULAR | Status: DC | PRN
  Administered 2022-04-14 – 2022-04-17 (×15): 1 mg via INTRAVENOUS
  Filled 2022-04-14 (×15): qty 1

## 2022-04-14 MED ORDER — POTASSIUM CHLORIDE 20 MEQ PO PACK
40.0000 meq | PACK | ORAL | Status: AC
  Administered 2022-04-14 (×2): 40 meq via ORAL
  Filled 2022-04-14 (×2): qty 2

## 2022-04-14 MED ORDER — POTASSIUM CHLORIDE 10 MEQ/100ML IV SOLN
10.0000 meq | INTRAVENOUS | Status: AC
  Administered 2022-04-14 (×4): 10 meq via INTRAVENOUS
  Filled 2022-04-14 (×4): qty 100

## 2022-04-14 MED ORDER — POLYETHYLENE GLYCOL 3350 17 G PO PACK
17.0000 g | PACK | Freq: Every day | ORAL | Status: DC
  Administered 2022-04-16 – 2022-04-17 (×2): 17 g via ORAL
  Filled 2022-04-14 (×2): qty 1

## 2022-04-14 NOTE — Progress Notes (Signed)
OT Cancellation Note  Patient Details Name: Betty [name] MRN: [id number] DOB: 01/29/1956   Cancelled Treatment:    Reason Eval/Treat Not Completed: Pain limiting ability to participate. Excessive pain and now on opposite side. Will hold and follow up as able.  Nikitas Davtyan L Dasie Chancellor 04/14/2022, 2:39 PM

## 2022-04-14 NOTE — Progress Notes (Signed)
PT Cancellation Note  Patient Details Name: BERKLIE DETHLEFS MRN: [id number] DOB: [birth year]   Cancelled Treatment:     PT order received and attempted am and pm but deferred 2* pt ongoing pain control issues.  Will follow.   Lanita Stammen 04/14/2022, 3:16 PM

## 2022-04-14 NOTE — Progress Notes (Addendum)
PROGRESS NOTE    Betty [name]  UXY:[id number] DOB: Dec 19, 1955 DOA: 04/06/2022 PCP: Pcp, No   Brief Narrative: 66 year old with past medical history significant for hypertension, tobacco abuse, EtOH abuse, GERD presenting with right-sided flank pain.  She reports her symptoms began with incontinence 2 months ago.  She had a few episodes and became concerned.  She went to urgent care.  Nothing was found on work-up.  She was sent home with 7 days of antibiotics.  Her symptoms did not improve.  She is also complaining of intermittent right flank pain that got worse over the week.  She presented with worsening symptoms to the ED.  CT abdomen and pelvis: Mild bilateral hydroureteronephrosis.  Both ureters are dilated down to the pelvis just proximal to the UVJ on each side where the ureteral urothelium may hyper enhance.  This is associated with circumferential bladder wall thickening suggestive of cystitis.  Marked: Low-density material measuring 6 mm in thickness surrounding the proximal and right mid ureter.  This probably reflects marked edema of the ureteral wall although contained.  Ureteric edema or lymphoma cannot be excluded.  Initially ED discussed with urology who recommend IV fluids and antibiotics.  Continue to have flank pain, renal function without significant improvement.  Urology has been formally consulted. IV antibiotics resume. Underwent Nephrostomy tube placement 7/28. Renal function improving.     Assessment & Plan:   Principal Problem:   AKI (acute kidney injury) ([place]) Active Problems:   Cystitis   Hydroureteronephrosis   Tobacco abuse   Alcohol abuse   Hyponatremia   Hypokalemia  1-AKI: In the setting of bilateral hydroureteronephrosis Unclear etiology, urology considering retroperitoneal fibrosis.  -No prior renal function records. Presented with a creatinine of 2.3 -Treated  with IV fluids. -Renal ultrasound: Moderate hydronephrosis and bilateral proximal  hydroureter. Ureteral jets in the urinary bladder were not identified during a five-minute observation. -Urology recommends continuation of IV antibiotics, IV fluids, follow up out patient. Subsequently urology recommended right side nephrostomy tube placement. -Foley catheter placed 7/27. -Underwent right percutaneous nephrostomy tube placement by IR 7/28. Renal function improving.  She is complaining of left side flank pain now, continue to have abdominal pain.  -Repeated CT scan showed unchanged left hydroureteronephrosis. New left perinephric inflammation, small to moderate amount left para renal space fluid.  -Discussed with urology, recommend IR evaluation for perc left nephrostomy tube placement.  Discussed with patient she wants to wait until tomorrow to see how is the pain, she does not want to have another tube.  Added robaxin PRN for pain.  Will check IgG 4, ESR  2-Anion gap metabolic acidosis: In the setting of AKI.  Continue with IV fluids Continue with  sodium bicarb tablet.  Resolved.   Hypokalemia: replete IV and orally. Repeat later today.  Hypomagnesemia; replete IV Hyponatremia: Resolved Alcohol abuse: Counseling provided CIWA protocol  Tobacco abuse: Continue with nicotine patch  Hydroureteronephrosis: Urology consulted. Underwent right side percutaneous nephrostomy tube.    Constipation; KUB negative. Started on laxative.  Continue to have abdominal pain.  KUB: mild gaseous distension of colon.   Hypertension:  Started  Norvasc. Increase norvasc.  IV hydralazine PRN  Mild hypocalcemia; replete orally.   Estimated body mass index is 19.07 kg/m as calculated from the following:   Height as of this encounter: 5' 6"  (1.676 m).   Weight as of this encounter: 53.6 kg.   DVT prophylaxis: Heparin  Code Status: DNR Family Communication: Care discussed with patient brother over phone.  Disposition Plan:  Status is: Inpatient Remains inpatient appropriate  because: Remain inpatient for management of hydroureteronephrosis.     Consultants:  Urology  Procedures:  Renal US.   Antimicrobials:    Subjective: She is complaining today of left side flank pain, abdominal pain.  Right side pain is some wheat better.   Objective: Vitals:   04/13/22 2043 04/14/22 0558 04/14/22 1202 04/14/22 1212  BP: (!) 174/91 (!) 176/91 (!) 158/85 137/79  Pulse: 99 89 (!) 108   Resp: 18 18    Temp: 98 F (36.7 C) 99.1 F (37.3 C)    TempSrc: Oral Oral    SpO2: 96% 91% 93%   Weight:      Height:        Intake/Output Summary (Last 24 hours) at 04/14/2022 1701 Last data filed at 04/14/2022 1648 Gross per 24 hour  Intake 2608.48 ml  Output 3690 ml  Net -1081.52 ml    Filed Weights   04/06/22 1428  Weight: 53.6 kg    Examination:  General exam: NAD Respiratory system:  CTA Cardiovascular system: S 1, S 2 RRR Gastrointestinal system: BS present, soft, tender lower quadrant. Nephrostomy tube in place.  Central nervous system: alert.  Extremities: no edema  Data Reviewed: I have personally reviewed following labs and imaging studies  CBC: Recent Labs  Lab 04/08/22 0524 04/09/22 0712 04/10/22 0640 04/11/22 0609 04/12/22 0810 04/13/22 0618 04/14/22 0807  WBC 13.2* 13.0* 14.0* 13.8* 8.4 15.3* 14.4*  NEUTROABS 10.7* 10.0*  --   --   --   --   --   HGB 10.5* 11.6* 11.4* 12.0 13.3 11.5* 11.6*  HCT 31.4* 36.1 35.3* 35.8* 41.0 33.2* 34.4*  MCV 99.7 103.4* 102.6* 98.9 94.7 96.2 97.5  PLT 408* 539* 538* 490* 281 499* 440*    Basic Metabolic Panel: Recent Labs  Lab 04/08/22 0524 04/09/22 0712 04/10/22 0541 04/10/22 0640 04/11/22 0609 04/12/22 1044 04/12/22 2014 04/12/22 2259 04/13/22 0618 04/13/22 1420 04/14/22 0807  NA 137   < > 143  --    < > 141 141  --  140 139 136  K 3.5   < > 3.3*  --    < > 2.1* 2.6* 3.4* 2.1* 3.5 2.6*  CL 107   < > 108  --    < > 101 100  --  97* 102 94*  CO2 19*   < > 19*  --    < > 19* 22  --  24 22  27   GLUCOSE 74   < > 84  --    < > 78 85  --  96 96 90  BUN 21   < > 16  --    < > 14 11  --  8 6* 5*  CREATININE 2.30*   < > 2.30*  --    < > 2.29* 1.44*  --  0.98 0.87 0.66  CALCIUM 8.8*   < > 8.9  --    < > 8.3* 8.3*  --  8.2* 8.2* 7.8*  MG 1.4*  --   --  1.8  --   --   --   --  1.2*  --  1.3*  PHOS  --   --  5.0*  --   --   --   --   --   --   --   --    < > = values in this interval not displayed.    GFR: Estimated  Creatinine Clearance: 60.1 mL/min (by C-G formula based on SCr of 0.66 mg/dL). Liver Function Tests: Recent Labs  Lab 04/10/22 0541 04/14/22 1436  AST  --  16  ALT  --  10  ALKPHOS  --  59  BILITOT  --  0.9  PROT  --  6.6  ALBUMIN 3.1* 3.2*    No results for input(s): "LIPASE", "AMYLASE" in the last 168 hours.  No results for input(s): "AMMONIA" in the last 168 hours. Coagulation Profile: Recent Labs  Lab 04/12/22 0605  INR 1.1    Cardiac Enzymes: No results for input(s): "CKTOTAL", "CKMB", "CKMBINDEX", "TROPONINI" in the last 168 hours. BNP (last 3 results) No results for input(s): "PROBNP" in the last 8760 hours. HbA1C: No results for input(s): "HGBA1C" in the last 72 hours. CBG: No results for input(s): "GLUCAP" in the last 168 hours. Lipid Profile: No results for input(s): "CHOL", "HDL", "LDLCALC", "TRIG", "CHOLHDL", "LDLDIRECT" in the last 72 hours. Thyroid Function Tests: No results for input(s): "TSH", "T4TOTAL", "FREET4", "T3FREE", "THYROIDAB" in the last 72 hours. Anemia Panel: No results for input(s): "VITAMINB12", "FOLATE", "FERRITIN", "TIBC", "IRON", "RETICCTPCT" in the last 72 hours. Sepsis Labs: No results for input(s): "PROCALCITON", "LATICACIDVEN" in the last 168 hours.  Recent Results (from the past 240 hour(s))  Urine Culture     Status: Abnormal   Collection Time: 04/06/22  8:31 AM   Specimen: Urine, Clean Catch  Result Value Ref Range Status   Specimen Description   Final    URINE, CLEAN CATCH Performed at [facility], [place] [address], [place], [place] [address]    Special Requests   Final    NONE Performed at [facility], [place] [address] [place], [place] [address]    Culture (A)  Final    <10,000 COLONIES/mL INSIGNIFICANT GROWTH Performed at [place] [address], [place], [place] [address]    Report Status 04/07/2022 FINAL  Final  Urine Culture     Status: None   Collection Time: 04/12/22  3:01 PM   Specimen: Urine, Random  Result Value Ref Range Status   Specimen Description   Final    URINE, RANDOM Performed at [place] [name], [place], [place] [address]    Special Requests   Final    RIGHT KIDNEY Performed at [facility], [place] [address], [place], [place] [address]    Culture   Final    NO GROWTH Performed at [facility], [place] [address] [place], [place] [address]    Report Status 04/13/2022 FINAL  Final         Radiology Studies: CT ABDOMEN PELVIS WO CONTRAST  Result Date: 04/14/2022 CLINICAL DATA:  66 year old female with abdominal and pelvic pain. Patient with acute kidney injury, urinary infection and bilateral hydronephrosis. RIGHT percutaneous nephrostomy tube placed on 04/12/2022. EXAM: CT ABDOMEN AND PELVIS WITHOUT CONTRAST TECHNIQUE: Multidetector CT imaging of the abdomen and pelvis was performed following the standard protocol without IV contrast. RADIATION DOSE REDUCTION: This exam was performed according to the departmental dose-optimization program which includes automated exposure control, adjustment of the mA and/or kV according to patient size and/or use of iterative reconstruction technique. COMPARISON:  None Available. FINDINGS: Please note that parenchymal and vascular abnormalities may be missed as intravenous contrast was not administered. Lower chest: A small RIGHT pleural effusion, trace LEFT pleural effusion and mild bibasilar atelectasis noted.  Hepatobiliary: Liver is unremarkable. Vicarious excretion of  contrast in the gallbladder is noted without other definite gallbladder abnormality. There is no evidence of intrahepatic or extrahepatic biliary dilatation. Pancreas: Unremarkable Spleen: Unremarkable Adrenals/Urinary Tract: A RIGHT percutaneous nephrostomy catheter is noted with tip in the RIGHT renal pelvis. A tiny focus of gas within the anterior calyx is noted. Moderate LEFT hydroureteronephrosis to the bladder and RIGHT hydroureter again identified. New mild LEFT perinephric inflammation and small to moderate amount of fluid in the LEFT pararenal spaces noted. A Foley catheter is noted within a collapsed bladder. No urinary calculi are identified. Stomach/Bowel: Stomach is within normal limits. No evidence of bowel wall thickening, distention, or inflammatory changes. No bowel obstruction identified. Vascular/Lymphatic: Aortic atherosclerosis. No enlarged abdominal or pelvic lymph nodes. Reproductive: Unremarkable Other: No ascites, focal collection or pneumoperitoneum. Musculoskeletal: No acute or suspicious bony abnormalities are noted. IMPRESSION: 1. Unchanged moderate LEFT hydroureteronephrosis to the bladder again noted but new mild LEFT perinephric inflammation and small to moderate amount of LEFT pararenal space fluid, of uncertain clinical significance but may represent increasing infection/inflammation. The pancreas is unremarkable but correlate with labs. 2. RIGHT percutaneous nephrostomy catheter with tip in the RIGHT renal pelvis. Unchanged RIGHT hydroureter. 3. New small RIGHT pleural effusion, trace LEFT pleural effusion and mild bibasilar atelectasis. 4. Aortic Atherosclerosis (ICD10-I70.0). Electronically Signed   By: Margarette Canada M.D.   On: 04/14/2022 13:42   DG Abd 1 View  Result Date: 04/13/2022 CLINICAL DATA:  [id number] generalized abdominal pain. Right nephrostomy tube. EXAM: ABDOMEN - 1 VIEW COMPARISON:  April 10, 2022 FINDINGS:  There has been interval right PCN tube insertion. There is mild gaseous distention of the colon seen and has mildly increased in the interim. Bowel-gas pattern is nonobstructive. Phleboliths in the pelvis. IMPRESSION: Right PCN tube and is new. Mild gaseous distention of the colon and has increased in the interim without evidence of bowel obstruction. Electronically Signed   By: Frazier Richards M.D.   On: 04/13/2022 15:14        Scheduled Meds:  amLODipine  10 mg Oral Daily   calcium carbonate  1 tablet Oral BID WC   Chlorhexidine Gluconate Cloth  6 each Topical Daily   docusate sodium  100 mg Oral BID   folic acid  1 mg Oral Daily   multivitamin with minerals  1 tablet Oral Daily   nicotine  21 mg Transdermal Daily   pantoprazole  40 mg Oral Daily   senna-docusate  1 tablet Oral BID   sodium bicarbonate  650 mg Oral TID   sodium chloride flush  5 mL Intracatheter Q8H   thiamine  100 mg Oral Daily   Or   thiamine  100 mg Intravenous Daily   Continuous Infusions:  cefTRIAXone (ROCEPHIN)  IV 2 g (04/14/22 1502)   lactated ringers 100 mL/hr at 04/14/22 0430     LOS: 8 days    Time spent: 35 minutes    Azion Centrella A Gracilyn Gunia, MD [facility]   If 7PM-7AM, please contact night-coverage [email or web address]  04/14/2022, 5:01 PM

## 2022-04-14 NOTE — Progress Notes (Signed)
Referring Physician(s): MacDiarmid,S  Supervising Physician: Mir, Sharen Heck  Patient Status:  [facility] - In-pt  Chief Complaint:  Pt with bilateral hydroureteronephrosis most likely caused by bladder wall thickening suggesting cystitis S/p R PCN placement due to  by Dr. Kathlene Cote on 7/28   Subjective:  Pt laying in bed, not in distress but in significant pain in the left side of the abdomen, she is waiting for CT.   Allergies: Patient has no known allergies.  Medications: Prior to Admission medications   Medication Sig Start Date End Date Taking? Authorizing Provider  acetaminophen (TYLENOL) 500 MG tablet Take 500 mg by mouth every 4 (four) hours as needed (pain).   Yes [provider]  esomeprazole (NEXIUM) 20 MG capsule Take 20 mg by mouth daily at 12 noon.   Yes [provider]  ibuprofen (ADVIL) 200 MG tablet Take 400 mg by mouth every 4 (four) hours as needed (pain).   Yes [provider]  MELATONIN PO Take 2 tablets by mouth at bedtime as needed (sleep).   Yes [provider]  Trolamine Salicylate (ASPERCREME EX) Apply 1 Application topically daily as needed (pain).   Yes [provider]     Vital Signs: BP 137/79   Pulse (!) 108   Temp 99.1 F (37.3 C) (Oral)   Resp 18   Ht '5\' 6"'$  (1.676 m)   Wt 118 lb 2.7 oz (53.6 kg)   SpO2 93%   BMI 19.07 kg/m   Physical Exam Vitals reviewed.  Constitutional:      General: She is not in acute distress.    Appearance: She is ill-appearing. She is not diaphoretic.  HENT:     Head: Normocephalic.  Pulmonary:     Effort: Pulmonary effort is normal.  Abdominal:     General: Abdomen is flat.     Palpations: Abdomen is soft.  Skin:    General: Skin is warm and dry.     Coloration: Skin is not jaundiced or pale.     Comments: R PCN, insertion site not checked due to patient in pain and not able to move well Clear urine with small amount of blood in the PCN bag. No clots.    Neurological:     Mental Status: She is alert and oriented to person, place, and time.  Psychiatric:     Comments: In pain      Imaging: DG Abd 1 View  Result Date: 04/13/2022 CLINICAL DATA:  [id number] generalized abdominal pain. Right nephrostomy tube. EXAM: ABDOMEN - 1 VIEW COMPARISON:  April 10, 2022 FINDINGS: There has been interval right PCN tube insertion. There is mild gaseous distention of the colon seen and has mildly increased in the interim. Bowel-gas pattern is nonobstructive. Phleboliths in the pelvis. IMPRESSION: Right PCN tube and is new. Mild gaseous distention of the colon and has increased in the interim without evidence of bowel obstruction. Electronically Signed   By: Frazier Richards M.D.   On: 04/13/2022 15:14   IR NEPHROSTOMY PLACEMENT RIGHT  Result Date: 04/12/2022 CLINICAL DATA:  Bilateral hydronephrosis, right greater than left. Acute kidney injury and urinary infection. Request for right percutaneous nephrostomy tube. EXAM: 1. ULTRASOUND GUIDANCE FOR PUNCTURE OF THE RIGHT RENAL COLLECTING SYSTEM. 2. Right PERCUTANEOUS NEPHROSTOMY TUBE PLACEMENT. COMPARISON:  Renal ultrasound on 04/10/2022 and CT of the abdomen and pelvis on 04/06/2022. ANESTHESIA/SEDATION: Moderate (conscious) sedation was employed during this procedure. A total of Versed 2.0 mg and Fentanyl 100 mcg was administered intravenously  by radiology nursing. Moderate Sedation Time: 14 minutes. The patient's level of consciousness and vital signs were monitored continuously by radiology nursing throughout the procedure under my direct supervision. CONTRAST:  Ten ml Omnipaque 300 MEDICATIONS: No additional medications. FLUOROSCOPY TIME:  3.0 mGy. PROCEDURE: The procedure, risks, benefits, and alternatives were explained to the patient. Questions regarding the procedure were encouraged and answered. The patient understands and consents to the procedure. A time-out was performed prior to initiating the procedure. The right  flank region was prepped with chlorhexidine in a sterile fashion, and a sterile drape was applied covering the operative field. A sterile gown and sterile gloves were used for the procedure. Local anesthesia was provided with 1% Lidocaine. Ultrasound was used to localize the right kidney. Under direct ultrasound guidance, a 21 gauge needle was advanced into the renal collecting system. Ultrasound image documentation was performed. Aspiration of urine sample was performed followed by contrast injection. A transitional dilator was advanced over a guidewire. Percutaneous tract dilatation was then performed over the guidewire. A 10-French percutaneous nephrostomy tube was then advanced and formed in the collecting system. Catheter position was confirmed by fluoroscopy after contrast injection. The catheter was attached to a gravity drainage bag and secured at the skin with a Prolene retention suture. COMPLICATIONS: None. FINDINGS: Ultrasound demonstrates moderate hydronephrosis of the right kidney. A nephrostomy tube was placed via lower pole caliceal access and formed at the level of the renal pelvis. There is return of blood tinged urine. A urine sample was sent for culture analysis. IMPRESSION: Right percutaneous nephrostomy tube placement. A 10 French catheter was placed and formed at the level of the renal pelvis. This was attached to gravity bag drainage. A urine sample was sent for culture analysis. Electronically Signed   By: Aletta Edouard M.D.   On: 04/12/2022 15:27   DG Abd 1 View  Result Date: 04/10/2022 CLINICAL DATA:  Lower right side abdominal pain. EXAM: ABDOMEN - 1 VIEW COMPARISON:  CT abdomen and pelvis 04/06/2022; renal ultrasound 04/10/2022 FINDINGS: Bowel gas is seen within nondistended loops of small and large bowel. Bilateral internal iliac arterial vascular calcifications are again seen. There are numerous vascular phleboliths overlying the pelvis as seen on prior CT. No definitive renal  stone is seen. No portal venous gas or pneumatosis. No acute skeletal abnormality. IMPRESSION: 1. Nonobstructed bowel-gas pattern. 2. Note is made of bilateral hydroureteronephrosis on prior CT including diffuse proximal 2/3 of the right ureteral edema and wall thickening. Electronically Signed   By: Yvonne Kendall M.D.   On: 04/10/2022 14:38   US RENAL  Result Date: 04/10/2022 CLINICAL DATA:  Acute kidney injury EXAM: RENAL / URINARY TRACT ULTRASOUND COMPLETE COMPARISON:  None Available. FINDINGS: Right Kidney: Renal measurements: 11.8 by 5.9 by 7.3 cm = volume: 263 mL. Moderate hydronephrosis and proximal hydroureter. Left Kidney: Renal measurements: 11.0 by 6.5 by 5.9 cm = volume: 222 mL. Moderate hydronephrosis and proximal hydroureter. Bladder: Appears normal for degree of bladder distention. Ureteral jets were not observed during a five-minute observation. Other: None. IMPRESSION: 1. Moderate hydronephrosis and bilateral proximal hydroureter. Ureteral jets in the urinary bladder were not identified during a five-minute observation. Electronically Signed   By: Van Clines M.D.   On: 04/10/2022 14:15    Labs:  CBC: Recent Labs    04/11/22 0609 04/12/22 0810 04/13/22 0618 04/14/22 0807  WBC 13.8* 8.4 15.3* 14.4*  HGB 12.0 13.3 11.5* 11.6*  HCT 35.8* 41.0 33.2* 34.4*  PLT 490* 281 499*  440*    COAGS: Recent Labs    04/12/22 0605  INR 1.1    BMP: Recent Labs    04/12/22 2014 04/12/22 2259 04/13/22 0618 04/13/22 1420 04/14/22 0807  NA 141  --  140 139 136  K 2.6* 3.4* 2.1* 3.5 2.6*  CL 100  --  97* 102 94*  CO2 22  --  '24 22 27  '$ GLUCOSE 85  --  96 96 90  BUN 11  --  8 6* 5*  CALCIUM 8.3*  --  8.2* 8.2* 7.8*  CREATININE 1.44*  --  0.98 0.87 0.66  GFRNONAA 41*  --  >60 >60 >60    LIVER FUNCTION TESTS: Recent Labs    04/06/22 0555 04/07/22 0629 04/10/22 0541  BILITOT 1.1 0.4  --   AST 26 14*  --   ALT 10 9  --   ALKPHOS 78 75  --   PROT 7.5 7.2  --    ALBUMIN 3.8 3.7 3.1*    Assessment and Plan:  66 y.o. female with bilateral hydroureteronephrosis most likely caused by bladder wall thickening suggesting cystitis; S/p R PCN placement due to  by Dr. Kathlene Cote on 7/28.   RF improved after R PCN placement, BUN 5 Creatinine 0.66 GFR greater than 60 today  OP clear urine, output good.  Hgb stable, VSS but hypertensive and tachycardia at times  Patient developed significant left abdominal pain, CT ordered per primary team.   PLAN for R PCN - Unclear how long patient will need the R PCN as etiology of bilateral hydronephrosis is unclear at this time, patient will  f/u with Dr. Tresa Moore.  - PCN required routine exchange every 6-8 weeks, exchange order placed.    Further treatment plan per Urology/TR[facility] Appreciate and agree with the plan.  Please call IR for questions and concerns.     Electronically Signed: Tera Mater, PA-C 04/14/2022, 12:22 PM   I spent a total of 15 Minutes at the the patient's bedside AND on the patient's hospital floor or unit, greater than 50% of which was counseling/coordinating care for R PCN f/u.  This chart was dictated using voice recognition software.  Despite best efforts to proofread,  errors can occur which can change the documentation meaning.

## 2022-04-14 NOTE — Consult Note (Signed)
Spoke to the hospitalist Dr. Tyrell Antonio who had ordered a CT scan due to ongoing abdominal pain and worsening left-sided flank pain.  I suggested that we consider placing a nephrostomy tube in the left kidney given that she now has left-sided flank pain and worsening perinephric inflammation and moderate hydro-.  Again, etiology of this process is unclear, but hopefully decompressing her left kidney will alleviate some of her pain.

## 2022-04-15 ENCOUNTER — Inpatient Hospital Stay (HOSPITAL_COMMUNITY): Payer: Medicare Other

## 2022-04-15 DIAGNOSIS — N179 Acute kidney failure, unspecified: Secondary | ICD-10-CM | POA: Diagnosis not present

## 2022-04-15 HISTORY — PX: IR US GUIDE VASC ACCESS RIGHT: IMG2390

## 2022-04-15 HISTORY — PX: IR RADIOLOGY PERIPHERAL GUIDED IV START: IMG5598

## 2022-04-15 HISTORY — PX: IR NEPHROSTOMY PLACEMENT LEFT: IMG6063

## 2022-04-15 LAB — LIPASE, BLOOD: Lipase: 33 U/L (ref 11–51)

## 2022-04-15 LAB — CBC
HCT: 35 % — ABNORMAL LOW (ref 36.0–46.0)
Hemoglobin: 11.5 g/dL — ABNORMAL LOW (ref 12.0–15.0)
MCH: 32.7 pg (ref 26.0–34.0)
MCHC: 32.9 g/dL (ref 30.0–36.0)
MCV: 99.4 fL (ref 80.0–100.0)
Platelets: 451 10*3/uL — ABNORMAL HIGH (ref 150–400)
RBC: 3.52 MIL/uL — ABNORMAL LOW (ref 3.87–5.11)
RDW: 14.3 % (ref 11.5–15.5)
WBC: 14.8 10*3/uL — ABNORMAL HIGH (ref 4.0–10.5)
nRBC: 0 % (ref 0.0–0.2)

## 2022-04-15 LAB — BASIC METABOLIC PANEL
Anion gap: 13 (ref 5–15)
BUN: 5 mg/dL — ABNORMAL LOW (ref 8–23)
CO2: 24 mmol/L (ref 22–32)
Calcium: 8 mg/dL — ABNORMAL LOW (ref 8.9–10.3)
Chloride: 98 mmol/L (ref 98–111)
Creatinine, Ser: 0.73 mg/dL (ref 0.44–1.00)
GFR, Estimated: >60 mL/min (ref >60)
Glucose, Bld: 81 mg/dL (ref 70–99)
Potassium: 3.8 mmol/L (ref 3.5–5.1)
Sodium: 135 mmol/L (ref 135–145)

## 2022-04-15 LAB — SEDIMENTATION RATE: Sed Rate: 51 mm/hr — ABNORMAL HIGH (ref 0–22)

## 2022-04-15 LAB — MAGNESIUM: Magnesium: 1.5 mg/dL — ABNORMAL LOW (ref 1.7–2.4)

## 2022-04-15 MED ORDER — MIDAZOLAM HCL 2 MG/2ML IJ SOLN
INTRAMUSCULAR | Status: AC
  Filled 2022-04-15: qty 2

## 2022-04-15 MED ORDER — POTASSIUM CHLORIDE CRYS ER 20 MEQ PO TBCR
40.0000 meq | EXTENDED_RELEASE_TABLET | Freq: Once | ORAL | Status: AC
  Administered 2022-04-15: 40 meq via ORAL
  Filled 2022-04-15: qty 2

## 2022-04-15 MED ORDER — FENTANYL CITRATE (PF) 100 MCG/2ML IJ SOLN
INTRAMUSCULAR | Status: AC
  Filled 2022-04-15: qty 4

## 2022-04-15 MED ORDER — MIDAZOLAM HCL 2 MG/2ML IJ SOLN
INTRAMUSCULAR | Status: AC | PRN
  Administered 2022-04-15: 1 mg via INTRAVENOUS

## 2022-04-15 MED ORDER — IOHEXOL 300 MG/ML  SOLN
50.0000 mL | Freq: Once | INTRAMUSCULAR | Status: AC | PRN
  Administered 2022-04-15: 10 mL

## 2022-04-15 MED ORDER — METOPROLOL TARTRATE 25 MG PO TABS
25.0000 mg | ORAL_TABLET | Freq: Two times a day (BID) | ORAL | Status: DC
  Administered 2022-04-15 – 2022-04-19 (×8): 25 mg via ORAL
  Filled 2022-04-15 (×8): qty 1

## 2022-04-15 MED ORDER — MIDAZOLAM HCL 5 MG/5ML IJ SOLN
INTRAMUSCULAR | Status: AC | PRN
  Administered 2022-04-15: 1 mg via INTRAVENOUS

## 2022-04-15 MED ORDER — FENTANYL CITRATE (PF) 100 MCG/2ML IJ SOLN
INTRAMUSCULAR | Status: AC | PRN
  Administered 2022-04-15: 50 ug via INTRAVENOUS

## 2022-04-15 MED ORDER — MAGNESIUM SULFATE 2 GM/50ML IV SOLN
2.0000 g | Freq: Once | INTRAVENOUS | Status: AC
  Administered 2022-04-15: 2 g via INTRAVENOUS
  Filled 2022-04-15: qty 50

## 2022-04-15 MED ORDER — LIDOCAINE HCL 1 % IJ SOLN
INTRAMUSCULAR | Status: AC
  Filled 2022-04-15: qty 20

## 2022-04-15 MED ORDER — SODIUM BICARBONATE 650 MG PO TABS
650.0000 mg | ORAL_TABLET | Freq: Two times a day (BID) | ORAL | Status: DC
  Administered 2022-04-15 – 2022-04-17 (×5): 650 mg via ORAL
  Filled 2022-04-15 (×5): qty 1

## 2022-04-15 MED ORDER — MAGNESIUM OXIDE -MG SUPPLEMENT 400 (240 MG) MG PO TABS
400.0000 mg | ORAL_TABLET | Freq: Two times a day (BID) | ORAL | Status: DC
  Administered 2022-04-15 – 2022-04-16 (×3): 400 mg via ORAL
  Filled 2022-04-15 (×5): qty 1

## 2022-04-15 NOTE — Progress Notes (Signed)
PROGRESS NOTE    Betty [name]  BJY:[id number] DOB: 09-18-1955 DOA: 04/06/2022 PCP: Pcp, No   Brief Narrative: 66 year old with past medical history significant for hypertension, tobacco abuse, EtOH abuse, GERD presenting with right-sided flank pain.  She reports her symptoms began with incontinence 2 months ago.  She had a few episodes and became concerned.  She went to urgent care.  Nothing was found on work-up.  She was sent home with 7 days of antibiotics.  Her symptoms did not improve.  She is also complaining of intermittent right flank pain that got worse over the week.  She presented with worsening symptoms to the ED.  CT abdomen and pelvis: Mild bilateral hydroureteronephrosis.  Both ureters are dilated down to the pelvis just proximal to the UVJ on each side where the ureteral urothelium may hyper enhance.  This is associated with circumferential bladder wall thickening suggestive of cystitis.  Marked: Low-density material measuring 6 mm in thickness surrounding the proximal and right mid ureter.  This probably reflects marked edema of the ureteral wall although contained.  Ureteric edema or lymphoma cannot be excluded.  Initially ED discussed with urology who recommend IV fluids and antibiotics.  Patient continue to have flank pain, renal function without significant improvement.  Urology was  formally consulted. IV antibiotics resume. Underwent Nephrostomy tube placement 7/28. Renal function improved.   Patient develops left flank pain, severe similar to right side flank pain on admission. CT scan repeated, showed unchanged left side hydroureteronephrosis, new left perinephric inflammatory changes.  Case discussed with Dr. Louis Meckel with urology he recommended placing a left side percutaneous nephrostomy tube to see if that will help with the pain.     Assessment & Plan:   Principal Problem:   AKI (acute kidney injury) ([place]) Active Problems:   Cystitis   Hydroureteronephrosis    Tobacco abuse   Alcohol abuse   Hyponatremia   Hypokalemia  1-AKI: In the setting of bilateral hydroureteronephrosis Unclear etiology, urology considering retroperitoneal fibrosis.  -No prior renal function records. Presented with a creatinine of 2.3 -Treated  with IV fluids. -Renal ultrasound: Moderate hydronephrosis and bilateral proximal hydroureter. Ureteral jets in the urinary bladder were not identified during a five-minute observation. -Urology recommends continuation of IV antibiotics, IV fluids, follow up out patient. Subsequently urology recommended right side nephrostomy tube placement. -Foley catheter placed 7/27. -Underwent right percutaneous nephrostomy tube placement by IR 7/28. -Renal function improved.  Patient started to complain of left side flank pain, continue to have abdominal pain.  -Repeated CT scan 7/30 ; showed unchanged left hydroureteronephrosis. New left perinephric inflammation, small to moderate amount left para renal space fluid.  -Discussed with urology, recommend IR evaluation for perc left nephrostomy tube placement.  -on robaxin PRN for pain.  -Check  IgG 4, ESR to evaluate for retroperitoneal fibrosis.  Plan for left side perc nephrostomy tube placement.   2-Anion gap metabolic acidosis: In the setting of AKI.  Continue with IV fluids Continue with  sodium bicarb tablet. Change to BID>  Resolved.   Hypokalemia: Replaced.  Hypomagnesemia; replete IV Hyponatremia: Resolved Alcohol abuse: Counseling provided CIWA protocol  Tobacco abuse: Continue with nicotine patch  Hydroureteronephrosis: Urology consulted. Underwent right side percutaneous nephrostomy tube.    Constipation; KUB negative. Started on laxative.  Continue to have abdominal pain.  KUB: mild gaseous distension of colon.   Hypertension:  Continue with Norvasc.  Will start low dose metoprolol.  IV hydralazine PRN  Mild hypocalcemia; replete orally.  Estimated body mass  index is 19.07 kg/m as calculated from the following:   Height as of this encounter: _0  (1.676 m).   Weight as of this encounter: 53.6 kg.   DVT prophylaxis: Heparin  Code Status: DNR Family Communication: Care discussed with patient brother over phone 7/30. Disposition Plan:  Status is: Inpatient Remains inpatient appropriate because: Remain inpatient for management of hydroureteronephrosis.     Consultants:  Urology  Procedures:  Renal US.   Antimicrobials:    Subjective: She agrees to proceed with left side perc nephrostomy tube placement.  She report right side flank pain improved after she had tube placed.   Objective: Vitals:   04/14/22 1212 04/14/22 2116 04/15/22 0505 04/15/22 1317  BP: 137/79 (!) 161/91 (!) 144/88 (!) 160/89  Pulse:  97 95 (!) 101  Resp:  _1 Temp:  98.9 F (37.2 C) 98.7 F (37.1 C) 98.7 F (37.1 C)  TempSrc:  Oral Oral Oral  SpO2:  93% 93% 94%  Weight:      Height:        Intake/Output Summary (Last 24 hours) at 04/15/2022 1543 Last data filed at 04/15/2022 1428 Gross per 24 hour  Intake 2975.39 ml  Output 3475 ml  Net -499.61 ml    Filed Weights   04/06/22 1428  Weight: 53.6 kg    Examination:  General exam: NAD Respiratory system: CTA Cardiovascular system: S 1, S 2 RRR Gastrointestinal system: BS present, soft, nt Nephrostomy tube in place.  Central nervous system: Alert Extremities: no edema  Data Reviewed: I have personally reviewed following labs and imaging studies  CBC: Recent Labs  Lab 04/09/22 0712 04/10/22 0640 04/11/22 0609 04/12/22 0810 04/13/22 0618 04/14/22 0807 04/15/22 0528  WBC 13.0*   < > 13.8* 8.4 15.3* 14.4* 14.8*  NEUTROABS 10.0*  --   --   --   --   --   --   HGB 11.6*   < > 12.0 13.3 11.5* 11.6* 11.5*  HCT 36.1   < > 35.8* 41.0 33.2* 34.4* 35.0*  MCV 103.4*   < > 98.9 94.7 96.2 97.5 99.4  PLT 539*   < > 490* 281 499* 440* 451*   < > = values in this interval not displayed.     Basic Metabolic Panel: Recent Labs  Lab 04/10/22 0541 04/10/22 0640 04/11/22 0609 04/12/22 2014 04/12/22 2259 04/13/22 0618 04/13/22 1420 04/14/22 0807 04/14/22 1724 04/15/22 0528  NA 143  --    < > 141  --  140 139 136  --  135  K 3.3*  --    < > 2.6*   < > 2.1* 3.5 2.6* 3.9 3.8  CL 108  --    < > 100  --  97* 102 94*  --  98  CO2 19*  --    < > 22  --  _2 --  24  GLUCOSE 84  --    < > 85  --  96 96 90  --  81  BUN 16  --    < > 11  --  8 6* 5*  --  5*  CREATININE 2.30*  --    < > 1.44*  --  0.98 0.87 0.66  --  0.73  CALCIUM 8.9  --    < > 8.3*  --  8.2* 8.2* 7.8*  --  8.0*  MG  --  1.8  --   --   --  1.2*  --  1.3*  --  1.5*  PHOS 5.0*  --   --   --   --   --   --   --   --   --    < > = values in this interval not displayed.    GFR: Estimated Creatinine Clearance: 60.1 mL/min (by C-G formula based on SCr of 0.73 mg/dL). Liver Function Tests: Recent Labs  Lab 04/10/22 0541 04/14/22 1436  AST  --  16  ALT  --  10  ALKPHOS  --  59  BILITOT  --  0.9  PROT  --  6.6  ALBUMIN 3.1* 3.2*    Recent Labs  Lab 04/15/22 0528  LIPASE 33    No results for input(s): "AMMONIA" in the last 168 hours. Coagulation Profile: Recent Labs  Lab 04/12/22 0605  INR 1.1    Cardiac Enzymes: No results for input(s): "CKTOTAL", "CKMB", "CKMBINDEX", "TROPONINI" in the last 168 hours. BNP (last 3 results) No results for input(s): "PROBNP" in the last 8760 hours. HbA1C: No results for input(s): "HGBA1C" in the last 72 hours. CBG: No results for input(s): "GLUCAP" in the last 168 hours. Lipid Profile: No results for input(s): "CHOL", "HDL", "LDLCALC", "TRIG", "CHOLHDL", "LDLDIRECT" in the last 72 hours. Thyroid Function Tests: No results for input(s): "TSH", "T4TOTAL", "FREET4", "T3FREE", "THYROIDAB" in the last 72 hours. Anemia Panel: No results for input(s): "VITAMINB12", "FOLATE", "FERRITIN", "TIBC", "IRON", "RETICCTPCT" in the last 72 hours. Sepsis Labs: No  results for input(s): "PROCALCITON", "LATICACIDVEN" in the last 168 hours.  Recent Results (from the past 240 hour(s))  Urine Culture     Status: Abnormal   Collection Time: 04/06/22  8:31 AM   Specimen: Urine, Clean Catch  Result Value Ref Range Status   Specimen Description   Final    URINE, CLEAN CATCH Performed at [facility], [place] [address] [place], [place] [address]    Special Requests   Final    NONE Performed at [facility], [place] [address], [place], [place] [address]    Culture (A)  Final    <10,000 COLONIES/mL INSIGNIFICANT GROWTH Performed at [facility] [address], [place], [place] [address]    Report Status 04/07/2022 FINAL  Final  Urine Culture     Status: None   Collection Time: 04/12/22  3:01 PM   Specimen: Urine, Random  Result Value Ref Range Status   Specimen Description   Final    URINE, RANDOM Performed at [facility] [address] [place], [place] [address]    Special Requests   Final    RIGHT KIDNEY Performed at [facility], [place] [address], [place], [place] [address]    Culture   Final    NO GROWTH Performed at [facility], [place] [address] [place],  [address]    Report Status 04/13/2022 FINAL  Final         Radiology Studies: CT ABDOMEN PELVIS WO CONTRAST  Result Date: 04/14/2022 CLINICAL DATA:  66 year old female with abdominal and pelvic pain. Patient with acute kidney injury, urinary infection and bilateral hydronephrosis. RIGHT percutaneous nephrostomy tube placed on 04/12/2022. EXAM: CT ABDOMEN AND PELVIS WITHOUT CONTRAST TECHNIQUE: Multidetector CT imaging of the abdomen and pelvis was performed following the standard protocol without IV contrast. RADIATION DOSE REDUCTION: This exam was performed according to the departmental dose-optimization program which includes automated exposure control, adjustment of the mA and/or kV  according  to patient size and/or use of iterative reconstruction technique. COMPARISON:  None Available. FINDINGS: Please note that parenchymal and vascular abnormalities may be missed as intravenous contrast was not administered. Lower chest: A small RIGHT pleural effusion, trace LEFT pleural effusion and mild bibasilar atelectasis noted. Hepatobiliary: Liver is unremarkable. Vicarious excretion of contrast in the gallbladder is noted without other definite gallbladder abnormality. There is no evidence of intrahepatic or extrahepatic biliary dilatation. Pancreas: Unremarkable Spleen: Unremarkable Adrenals/Urinary Tract: A RIGHT percutaneous nephrostomy catheter is noted with tip in the RIGHT renal pelvis. A tiny focus of gas within the anterior calyx is noted. Moderate LEFT hydroureteronephrosis to the bladder and RIGHT hydroureter again identified. New mild LEFT perinephric inflammation and small to moderate amount of fluid in the LEFT pararenal spaces noted. A Foley catheter is noted within a collapsed bladder. No urinary calculi are identified. Stomach/Bowel: Stomach is within normal limits. No evidence of bowel wall thickening, distention, or inflammatory changes. No bowel obstruction identified. Vascular/Lymphatic: Aortic atherosclerosis. No enlarged abdominal or pelvic lymph nodes. Reproductive: Unremarkable Other: No ascites, focal collection or pneumoperitoneum. Musculoskeletal: No acute or suspicious bony abnormalities are noted. IMPRESSION: 1. Unchanged moderate LEFT hydroureteronephrosis to the bladder again noted but new mild LEFT perinephric inflammation and small to moderate amount of LEFT pararenal space fluid, of uncertain clinical significance but may represent increasing infection/inflammation. The pancreas is unremarkable but correlate with labs. 2. RIGHT percutaneous nephrostomy catheter with tip in the RIGHT renal pelvis. Unchanged RIGHT hydroureter. 3. New small RIGHT pleural effusion, trace LEFT  pleural effusion and mild bibasilar atelectasis. 4. Aortic Atherosclerosis (ICD10-I70.0). Electronically Signed   By: Margarette Canada M.D.   On: 04/14/2022 13:42        Scheduled Meds:  amLODipine  10 mg Oral Daily   calcium carbonate  1 tablet Oral BID WC   Chlorhexidine Gluconate Cloth  6 each Topical Daily   docusate sodium  100 mg Oral BID   folic acid  1 mg Oral Daily   magnesium oxide  400 mg Oral BID   multivitamin with minerals  1 tablet Oral Daily   nicotine  21 mg Transdermal Daily   pantoprazole  40 mg Oral Daily   polyethylene glycol  17 g Oral Daily   senna-docusate  1 tablet Oral BID   sodium bicarbonate  650 mg Oral BID   sodium chloride flush  5 mL Intracatheter Q8H   thiamine  100 mg Oral Daily   Or   thiamine  100 mg Intravenous Daily   Continuous Infusions:  cefTRIAXone (ROCEPHIN)  IV Stopped (04/14/22 1532)   lactated ringers 100 mL/hr at 04/15/22 1428     LOS: 9 days    Time spent: 35 minutes    Betty [name] A Dannisha Eckmann, MD [facility]   If 7PM-7AM, please contact night-coverage [email or web address]  04/15/2022, 3:43 PM

## 2022-04-15 NOTE — Progress Notes (Signed)
Referring Physician(s): MacDiramid, S  Supervising Physician: Corrie Mckusick  Patient Status:  [facility] - In-pt  Chief Complaint:  Bilateral hydroureteronephrosis s/p R PCN placement by Dr Kathlene Cote on 04/12/22  Subjective:  Pt awake, alert, and taking medication with RN on arrival to room. Patient states she is in significant pain across her entire abdomen. She states she is NPO in case her primary team orders a L PCN tube.  Allergies: Patient has no known allergies.  Medications: Prior to Admission medications   Medication Sig Start Date End Date Taking? Authorizing Provider  acetaminophen (TYLENOL) 500 MG tablet Take 500 mg by mouth every 4 (four) hours as needed (pain).   Yes [provider]  esomeprazole (NEXIUM) 20 MG capsule Take 20 mg by mouth daily at 12 noon.   Yes [provider]  ibuprofen (ADVIL) 200 MG tablet Take 400 mg by mouth every 4 (four) hours as needed (pain).   Yes [provider]  MELATONIN PO Take 2 tablets by mouth at bedtime as needed (sleep).   Yes [provider]  Trolamine Salicylate (ASPERCREME EX) Apply 1 Application topically daily as needed (pain).   Yes [provider]     Vital Signs: BP (!) 144/88 (BP Location: Left Arm)   Pulse 95   Temp 98.7 F (37.1 C) (Oral)   Resp 18   Ht '5\' 6"'$  (1.676 m)   Wt 118 lb 2.7 oz (53.6 kg)   SpO2 93%   BMI 19.07 kg/m   Physical Exam Constitutional:      General: She is not in acute distress.    Appearance: She is ill-appearing.  Cardiovascular:     Rate and Rhythm: Normal rate and regular rhythm.     Pulses: Normal pulses.     Heart sounds: Normal heart sounds.  Pulmonary:     Effort: Pulmonary effort is normal.     Breath sounds: Normal breath sounds. No stridor. No wheezing or rales.     Comments: Pt yelps with pain on deep inspiration Abdominal:     Tenderness: There is abdominal tenderness. There is guarding. There is no rebound.  Skin:     General: Skin is warm and dry.     Comments: Right PCN tube in place. Insertion site is unremarkable with no erythema, no induration, and no drainage at skin. Attached to gravity bag with ~100 mL of straw-colored urine with pink tinge. Dressing is clean, dry, and intact  Neurological:     Mental Status: She is alert and oriented to person, place, and time.  Psychiatric:        Mood and Affect: Mood normal.        Behavior: Behavior normal.        Imaging: CT ABDOMEN PELVIS WO CONTRAST  Result Date: 04/14/2022 CLINICAL DATA:  66 year old female with abdominal and pelvic pain. Patient with acute kidney injury, urinary infection and bilateral hydronephrosis. RIGHT percutaneous nephrostomy tube placed on 04/12/2022. EXAM: CT ABDOMEN AND PELVIS WITHOUT CONTRAST TECHNIQUE: Multidetector CT imaging of the abdomen and pelvis was performed following the standard protocol without IV contrast. RADIATION DOSE REDUCTION: This exam was performed according to the departmental dose-optimization program which includes automated exposure control, adjustment of the mA and/or kV according to patient size and/or use of iterative reconstruction technique. COMPARISON:  None Available. FINDINGS: Please note that parenchymal and vascular abnormalities may be missed as intravenous contrast was not administered. Lower chest: A small RIGHT pleural effusion, trace LEFT pleural effusion  and mild bibasilar atelectasis noted. Hepatobiliary: Liver is unremarkable. Vicarious excretion of contrast in the gallbladder is noted without other definite gallbladder abnormality. There is no evidence of intrahepatic or extrahepatic biliary dilatation. Pancreas: Unremarkable Spleen: Unremarkable Adrenals/Urinary Tract: A RIGHT percutaneous nephrostomy catheter is noted with tip in the RIGHT renal pelvis. A tiny focus of gas within the anterior calyx is noted. Moderate LEFT hydroureteronephrosis to the bladder and RIGHT hydroureter again  identified. New mild LEFT perinephric inflammation and small to moderate amount of fluid in the LEFT pararenal spaces noted. A Foley catheter is noted within a collapsed bladder. No urinary calculi are identified. Stomach/Bowel: Stomach is within normal limits. No evidence of bowel wall thickening, distention, or inflammatory changes. No bowel obstruction identified. Vascular/Lymphatic: Aortic atherosclerosis. No enlarged abdominal or pelvic lymph nodes. Reproductive: Unremarkable Other: No ascites, focal collection or pneumoperitoneum. Musculoskeletal: No acute or suspicious bony abnormalities are noted. IMPRESSION: 1. Unchanged moderate LEFT hydroureteronephrosis to the bladder again noted but new mild LEFT perinephric inflammation and small to moderate amount of LEFT pararenal space fluid, of uncertain clinical significance but may represent increasing infection/inflammation. The pancreas is unremarkable but correlate with labs. 2. RIGHT percutaneous nephrostomy catheter with tip in the RIGHT renal pelvis. Unchanged RIGHT hydroureter. 3. New small RIGHT pleural effusion, trace LEFT pleural effusion and mild bibasilar atelectasis. 4. Aortic Atherosclerosis (ICD10-I70.0). Electronically Signed   By: Margarette Canada M.D.   On: 04/14/2022 13:42   DG Abd 1 View  Result Date: 04/13/2022 CLINICAL DATA:  [id number] generalized abdominal pain. Right nephrostomy tube. EXAM: ABDOMEN - 1 VIEW COMPARISON:  April 10, 2022 FINDINGS: There has been interval right PCN tube insertion. There is mild gaseous distention of the colon seen and has mildly increased in the interim. Bowel-gas pattern is nonobstructive. Phleboliths in the pelvis. IMPRESSION: Right PCN tube and is new. Mild gaseous distention of the colon and has increased in the interim without evidence of bowel obstruction. Electronically Signed   By: Frazier Richards M.D.   On: 04/13/2022 15:14   IR NEPHROSTOMY PLACEMENT RIGHT  Result Date: 04/12/2022 CLINICAL DATA:   Bilateral hydronephrosis, right greater than left. Acute kidney injury and urinary infection. Request for right percutaneous nephrostomy tube. EXAM: 1. ULTRASOUND GUIDANCE FOR PUNCTURE OF THE RIGHT RENAL COLLECTING SYSTEM. 2. Right PERCUTANEOUS NEPHROSTOMY TUBE PLACEMENT. COMPARISON:  Renal ultrasound on 04/10/2022 and CT of the abdomen and pelvis on 04/06/2022. ANESTHESIA/SEDATION: Moderate (conscious) sedation was employed during this procedure. A total of Versed 2.0 mg and Fentanyl 100 mcg was administered intravenously by radiology nursing. Moderate Sedation Time: 14 minutes. The patient's level of consciousness and vital signs were monitored continuously by radiology nursing throughout the procedure under my direct supervision. CONTRAST:  Ten ml Omnipaque 300 MEDICATIONS: No additional medications. FLUOROSCOPY TIME:  3.0 mGy. PROCEDURE: The procedure, risks, benefits, and alternatives were explained to the patient. Questions regarding the procedure were encouraged and answered. The patient understands and consents to the procedure. A time-out was performed prior to initiating the procedure. The right flank region was prepped with chlorhexidine in a sterile fashion, and a sterile drape was applied covering the operative field. A sterile gown and sterile gloves were used for the procedure. Local anesthesia was provided with 1% Lidocaine. Ultrasound was used to localize the right kidney. Under direct ultrasound guidance, a 21 gauge needle was advanced into the renal collecting system. Ultrasound image documentation was performed. Aspiration of urine sample was performed followed by contrast injection. A transitional dilator was advanced over  a guidewire. Percutaneous tract dilatation was then performed over the guidewire. A 10-French percutaneous nephrostomy tube was then advanced and formed in the collecting system. Catheter position was confirmed by fluoroscopy after contrast injection. The catheter was attached  to a gravity drainage bag and secured at the skin with a Prolene retention suture. COMPLICATIONS: None. FINDINGS: Ultrasound demonstrates moderate hydronephrosis of the right kidney. A nephrostomy tube was placed via lower pole caliceal access and formed at the level of the renal pelvis. There is return of blood tinged urine. A urine sample was sent for culture analysis. IMPRESSION: Right percutaneous nephrostomy tube placement. A 10 French catheter was placed and formed at the level of the renal pelvis. This was attached to gravity bag drainage. A urine sample was sent for culture analysis. Electronically Signed   By: Aletta Edouard M.D.   On: 04/12/2022 15:27    Labs:  CBC: Recent Labs    04/12/22 0810 04/13/22 0618 04/14/22 0807 04/15/22 0528  WBC 8.4 15.3* 14.4* 14.8*  HGB 13.3 11.5* 11.6* 11.5*  HCT 41.0 33.2* 34.4* 35.0*  PLT 281 499* 440* 451*    COAGS: Recent Labs    04/12/22 0605  INR 1.1    BMP: Recent Labs    04/13/22 0618 04/13/22 1420 04/14/22 0807 04/14/22 1724 04/15/22 0528  NA 140 139 136  --  135  K 2.1* 3.5 2.6* 3.9 3.8  CL 97* 102 94*  --  98  CO2 '24 22 27  '$ --  24  GLUCOSE 96 96 90  --  81  BUN 8 6* 5*  --  5*  CALCIUM 8.2* 8.2* 7.8*  --  8.0*  CREATININE 0.98 0.87 0.66  --  0.73  GFRNONAA >60 >60 >60  --  >60    LIVER FUNCTION TESTS: Recent Labs    04/06/22 0555 04/07/22 0629 04/10/22 0541 04/14/22 1436  BILITOT 1.1 0.4  --  0.9  AST 26 14*  --  16  ALT 10 9  --  10  ALKPHOS 78 75  --  59  PROT 7.5 7.2  --  6.6  ALBUMIN 3.8 3.7 3.1* 3.2*    Assessment and Plan:  Betty [name] is a 66 yo female with bilateral hydroureteronephrosis s/p R PCN placement by Dr Kathlene Cote on 04/12/22. Renal function has improved after R PCN placement, but L hydroureteronephrosis persists. Patient today is complaining of bilateral flank pain as well as generalized abdominal pain. Order placed for left PCN placement was reviewed and approved by Dr Earleen Newport possibly  for 04/15/22. IR will continue to follow.   Risks and benefits of left PCN placement was discussed with the patient including, but not limited to, infection, bleeding, significant bleeding causing loss or decrease in renal function or damage to adjacent structures.   All of the patient's questions were answered, patient is agreeable to proceed.  Consent signed and in chart.    Electronically Signed: Lura Em, PA-C 04/15/2022, 10:19 AM   I spent a total of 15 Minutes at the the patient's bedside AND on the patient's hospital floor or unit, greater than 50% of which was counseling/coordinating care for Bilateral hydroureteronephrosis s/p R PCN placement by Dr Kathlene Cote on 04/12/22

## 2022-04-15 NOTE — Procedures (Signed)
Interventional Radiology Procedure Note   Patient arrives with non-functioning IV.   Need for IV sedation for procedure  Procedure: Placement of a right brachial vein venipuncture. Aspirates and flushes Complications: None Recommendations:  - Ok to use  Signed,  Dulcy Fanny. Earleen Newport, DO

## 2022-04-15 NOTE — Progress Notes (Signed)
I reviewed the chart. Patient had percutaneous tube placed on the right side on Friday.  Has had a repeat scan for left-sided pain since then.  She had some perinephric stranding on the left but no increasing hydroureter.  There was consideration for percutaneous tube placement yesterday for diffuse and left-sided pain but she want to wait for today.  Pain is continuing based upon review of chart and interventional radiology is going to place a tube on the left.  White blood count is still mildly elevated.  Serum creatinine is completely normal since placement of right-sided nephrostomy tube.  Urine culture has been negative.  I plan on seeing the patient this afternoon

## 2022-04-15 NOTE — Procedures (Signed)
Interventional Radiology Procedure Note  Procedure: Image guided drain placement, left kidney, PCN.  90F pigtail drain.  Complications: None  EBL: None    Recommendations: - Routine drain care record output   - routine wound care  Signed,  Dulcy Fanny. Earleen Newport, DO

## 2022-04-15 NOTE — Evaluation (Signed)
Occupational Therapy Evaluation Patient Details Name: Betty [name] MRN: [id number] DOB: February 22, 1956 Today's Date: 04/15/2022   History of Present Illness 66 y.o. female presenting to [facility] ED 7/22 with worsening R flank pain and incontinence x2 months. Endorses N/V secondary to pain. Work-up (+) bilateral hydroureteronephrosis most likely secondary to cystitis. S/p R PCN placement  by Dr. Kathlene Cote on 7/28. Patient also found to have AKI, anion gap metabolic acidosis and electrolye imbalance. PMHx significant for HTN, tobacco use, EtOH use and GERD.   Clinical Impression   PTA patient was living alone in a 1-level private residence and was grossly independent with ADLs/IADLs without AD. Patient currently functioning near baseline demonstrating observed ADLs including toileting, LB dressing and IADLs including light housekeeping and bed making per patient request with supervision A for safety with lines/leads only. Patient limited by intermittent sharp pain in abdomen but demonstrates functional mobility 120f+ in hallway without AD. Patient does not require continued acute occupational therapy services at this time with OT to sign off. Recommend continued mobility with nursing staff and mobility tech in prep for safe d/c home.      Recommendations for follow up therapy are one component of a multi-disciplinary discharge planning process, led by the attending physician.  Recommendations may be updated based on patient status, additional functional criteria and insurance authorization.   Follow Up Recommendations  No OT follow up    Assistance Recommended at Discharge PRN  Patient can return home with the following Assist for transportation;Assistance with cooking/housework    Functional Status Assessment  Patient has not had a recent decline in their functional status  Equipment Recommendations  None recommended by OT    Recommendations for Other Services       Precautions / Restrictions  Precautions Precautions: Fall Precaution Comments: Low fall risk, R nephrostomy tube, foley cath Restrictions Weight Bearing Restrictions: No      Mobility Bed Mobility Overal bed mobility: Modified Independent                  Transfers Overall transfer level: Modified independent                        Balance Overall balance assessment: Mild deficits observed, not formally tested                                         ADL either performed or assessed with clinical judgement   ADL Overall ADL's : Needs assistance/impaired     Grooming: Supervision/safety;Standing   Upper Body Bathing: Supervision/ safety;Sitting   Lower Body Bathing: Supervison/ safety;Sit to/from stand   Upper Body Dressing : Supervision/safety;Sitting   Lower Body Dressing: Supervision/safety;Sit to/from stand   Toilet Transfer: Supervision/safety   Toileting- CWater quality scientistand Hygiene: Supervision/safety;Sit to/from stand       Functional mobility during ADLs: Supervision/safety General ADL Comments: Grossly supervision A without AD. Able to manage IV pole without external assist. Decreased safety awareness.     Vision Baseline Vision/History: 1 Wears glasses Ability to See in Adequate Light: 0 Adequate Patient Visual Report: No change from baseline Vision Assessment?: No apparent visual deficits     Perception     Praxis      Pertinent Vitals/Pain Pain Assessment Pain Assessment: Faces Faces Pain Scale: Hurts little more Pain Location: Abdomen Pain Descriptors / Indicators: Aching, Sharp Pain Intervention(s): Limited activity  within patient's tolerance, Monitored during session, Premedicated before session, Repositioned     Hand Dominance Right   Extremity/Trunk Assessment Upper Extremity Assessment Upper Extremity Assessment: Overall WFL for tasks assessed   Lower Extremity Assessment Lower Extremity Assessment: Overall WFL for  tasks assessed   Cervical / Trunk Assessment Cervical / Trunk Assessment: Normal   Communication Communication Communication: No difficulties   Cognition Arousal/Alertness: Awake/alert Behavior During Therapy: Anxious Overall Cognitive Status: Within Functional Limits for tasks assessed                                       General Comments       Exercises     Shoulder Instructions      Home Living Family/patient expects to be discharged to:: Private residence Living Arrangements: Alone Available Help at Discharge: Other (Comment) (None) Type of Home: House       Home Layout: One level     Bathroom Shower/Tub: Teacher, early years/pre: Standard Bathroom Accessibility: Yes How Accessible: Accessible via walker Home Equipment: Conservation officer, nature (2 wheels);BSC/3in1;Shower seat;Grab bars - tub/shower          Prior Functioning/Environment Prior Level of Function : Independent/Modified Independent                        OT Problem List: Pain      OT Treatment/Interventions:      OT Goals(Current goals can be found in the care plan section) Acute Rehab OT Goals Patient Stated Goal: To decrease pain OT Goal Formulation: With patient  OT Frequency:      Co-evaluation              AM-PAC OT "6 Clicks" Daily Activity     Outcome Measure Help from another person eating meals?: None Help from another person taking care of personal grooming?: A Little Help from another person toileting, which includes using toliet, bedpan, or urinal?: A Little Help from another person bathing (including washing, rinsing, drying)?: A Little Help from another person to put on and taking off regular upper body clothing?: A Little Help from another person to put on and taking off regular lower body clothing?: A Little 6 Click Score: 19   End of Session Equipment Utilized During Treatment: Gait belt Nurse Communication: Mobility  status  Activity Tolerance: Patient tolerated treatment well;No increased pain Patient left: in bed;with call bell/phone within reach;with bed alarm set  OT Visit Diagnosis: Unsteadiness on feet (R26.81);Muscle weakness (generalized) (M62.81)                Time: 3354-5625 OT Time Calculation (min): 37 min Charges:  OT General Charges $OT Visit: 1 Visit OT Evaluation $OT Eval Low Complexity: 1 Low OT Treatments $Self Care/Home Management : 8-22 mins  Nazar Kuan H. OTR/L Supplemental OT, Department of rehab services [phone]  Damesha Lawler R H. 04/15/2022, 11:13 AM

## 2022-04-15 NOTE — Evaluation (Signed)
Physical Therapy Evaluation Patient Details Name: Betty [name] MRN: [id number] DOB: [birth year] Today's Date: 04/15/2022  History of Present Illness  66 y.o. female presenting to [facility] ED 7/22 with worsening R flank pain and incontinence x2 months. Endorses N/V secondary to pain. Work-up (+) bilateral hydroureteronephrosis most likely secondary to cystitis. S/p R PCN placement  by Dr. Kathlene Cote on 7/28. Patient also found to have AKI, anion gap metabolic acidosis and electrolye imbalance. PMHx significant for HTN, tobacco use, EtOH use and GERD.   Clinical Impression  Patient evaluated by Physical Therapy with no further acute PT needs identified. All education has been completed and the patient has no further questions. Pt reporting is at her baseline level of functioning and is IND with mobility without any concerns for returning home, other than her current pain level. Pt demonstrated modified independence for bed mobility and transfers for increased time and management of lines and leads. Pt demonstrated functional mobility for in-room ambulation at supervision level of assistance using first IV pole then no AD without overt LOB; pt declined to ambulate further distance or out into hallway. Recommending continued mobility with nursing staff and mobility technicians in preparation for safe discharge home.  PT is signing off. Thank you for this referral.     Recommendations for follow up therapy are one component of a multi-disciplinary discharge planning process, led by the attending physician.  Recommendations may be updated based on patient status, additional functional criteria and insurance authorization.  Follow Up Recommendations No PT follow up      Assistance Recommended at Discharge PRN  Patient can return home with the following  A little help with walking and/or transfers;Help with stairs or ramp for entrance;Assist for transportation;Assistance with cooking/housework;A little help with  bathing/dressing/bathroom    Equipment Recommendations None recommended by PT  Recommendations for Other Services       Functional Status Assessment Patient has not had a recent decline in their functional status     Precautions / Restrictions Precautions Precautions: Fall Precaution Comments: Low fall risk, R nephrostomy tube, foley cath Restrictions Weight Bearing Restrictions: No      Mobility  Bed Mobility Overal bed mobility: Modified Independent                  Transfers Overall transfer level: Modified independent                      Ambulation/Gait Ambulation/Gait assistance: Min guard, Supervision Gait Distance (Feet): 30 Feet Assistive device: IV Pole, None Gait Pattern/deviations: Step-through pattern, Decreased step length - right, Decreased step length - left, Drifts right/left, Narrow base of support Gait velocity: decreased     General Gait Details: Pt ambulated with IV pole then advanced to no AD, min guard to supervision, no physical assist required or overt  LOB noted.  Stairs            Wheelchair Mobility    Modified Rankin (Stroke Patients Only)       Balance Overall balance assessment: Mild deficits observed, not formally tested                                           Pertinent Vitals/Pain Pain Assessment Pain Assessment: Faces Faces Pain Scale: Hurts whole lot Pain Location: Abdomen Pain Descriptors / Indicators: Aching, Sharp Pain Intervention(s): Limited activity within patient's tolerance, Monitored  during session, Repositioned    Home Living Family/patient expects to be discharged to:: Private residence Living Arrangements: Alone Available Help at Discharge: Other (Comment) (None) Type of Home: House         Home Layout: One level Home Equipment: Conservation officer, nature (2 wheels);BSC/3in1;Shower seat;Grab bars - tub/shower      Prior Function Prior Level of Function :  Independent/Modified Independent             Mobility Comments: IND ADLs Comments: IND     Hand Dominance   Dominant Hand: Right    Extremity/Trunk Assessment   Upper Extremity Assessment Upper Extremity Assessment: Overall WFL for tasks assessed    Lower Extremity Assessment Lower Extremity Assessment: Overall WFL for tasks assessed    Cervical / Trunk Assessment Cervical / Trunk Assessment: Normal  Communication   Communication: No difficulties  Cognition Arousal/Alertness: Awake/alert Behavior During Therapy: Anxious Overall Cognitive Status: Within Functional Limits for tasks assessed                                          General Comments      Exercises     Assessment/Plan    PT Assessment Patient does not need any further PT services  PT Problem List Decreased strength;Decreased range of motion;Decreased activity tolerance;Decreased balance;Decreased mobility;Decreased safety awareness;Pain       PT Treatment Interventions      PT Goals (Current goals can be found in the Care Plan section)       Frequency       Co-evaluation               AM-PAC PT "6 Clicks" Mobility  Outcome Measure Help needed turning from your back to your side while in a flat bed without using bedrails?: None Help needed moving from lying on your back to sitting on the side of a flat bed without using bedrails?: None Help needed moving to and from a bed to a chair (including a wheelchair)?: None Help needed standing up from a chair using your arms (e.g., wheelchair or bedside chair)?: A Little Help needed to walk in hospital room?: A Little Help needed climbing 3-5 steps with a railing? : A Little 6 Click Score: 21    End of Session   Activity Tolerance: Patient tolerated treatment well;No increased pain Patient left: in bed;with call bell/phone within reach;with bed alarm set Nurse Communication: Mobility status PT Visit Diagnosis:  Unsteadiness on feet (R26.81)    Time: 9485-4627 PT Time Calculation (min) (ACUTE ONLY): 17 min   Charges:   PT Evaluation $PT Eval Low Complexity: 1 Low          Coolidge Breeze, PT, DPT [facility] Department Office: [phone] Pager: [phone]  Coolidge Breeze 04/15/2022, 3:24 PM

## 2022-04-16 DIAGNOSIS — N179 Acute kidney failure, unspecified: Secondary | ICD-10-CM | POA: Diagnosis not present

## 2022-04-16 DIAGNOSIS — N1330 Unspecified hydronephrosis: Secondary | ICD-10-CM

## 2022-04-16 LAB — CBC
HCT: 33.3 % — ABNORMAL LOW (ref 36.0–46.0)
Hemoglobin: 11 g/dL — ABNORMAL LOW (ref 12.0–15.0)
MCH: 33 pg (ref 26.0–34.0)
MCHC: 33 g/dL (ref 30.0–36.0)
MCV: 100 fL (ref 80.0–100.0)
Platelets: 420 10*3/uL — ABNORMAL HIGH (ref 150–400)
RBC: 3.33 MIL/uL — ABNORMAL LOW (ref 3.87–5.11)
RDW: 14.2 % (ref 11.5–15.5)
WBC: 17.1 10*3/uL — ABNORMAL HIGH (ref 4.0–10.5)
nRBC: 0 % (ref 0.0–0.2)

## 2022-04-16 LAB — BASIC METABOLIC PANEL
Anion gap: 14 (ref 5–15)
BUN: 5 mg/dL — ABNORMAL LOW (ref 8–23)
CO2: 24 mmol/L (ref 22–32)
Calcium: 7.9 mg/dL — ABNORMAL LOW (ref 8.9–10.3)
Chloride: 99 mmol/L (ref 98–111)
Creatinine, Ser: 0.57 mg/dL (ref 0.44–1.00)
GFR, Estimated: >60 mL/min (ref >60)
Glucose, Bld: 83 mg/dL (ref 70–99)
Potassium: 3.2 mmol/L — ABNORMAL LOW (ref 3.5–5.1)
Sodium: 137 mmol/L (ref 135–145)

## 2022-04-16 LAB — MAGNESIUM: Magnesium: 1.8 mg/dL (ref 1.7–2.4)

## 2022-04-16 LAB — IGG 4: IgG, Subclass 4: 11 mg/dL (ref 2–96)

## 2022-04-16 MED ORDER — MAGNESIUM SULFATE 2 GM/50ML IV SOLN
2.0000 g | Freq: Once | INTRAVENOUS | Status: AC
  Administered 2022-04-16: 2 g via INTRAVENOUS
  Filled 2022-04-16: qty 50

## 2022-04-16 MED ORDER — POTASSIUM CHLORIDE CRYS ER 20 MEQ PO TBCR
40.0000 meq | EXTENDED_RELEASE_TABLET | ORAL | Status: AC
  Administered 2022-04-16 (×2): 40 meq via ORAL
  Filled 2022-04-16 (×2): qty 2

## 2022-04-16 NOTE — Progress Notes (Signed)
Referring Physician(s): MacDiramid, S  Supervising Physician: Daryll Brod  Patient Status:  [facility] - In-pt  Chief Complaint:  Bilateral hydroureteronephrosis s/p R PCN placement by Dr Kathlene Cote on 04/12/22, L PCN placement 7/31 by Dr Earleen Newport  Subjective:  Pt awake, alert, lying in bed at time of exam. She states her pain is somewhat improved after having the left PCN placement yesterday, but that her insertion sites are sore bilaterally. She also states she has continued diffuse abdominal pain.  Allergies: Patient has no known allergies.  Medications: Prior to Admission medications   Medication Sig Start Date End Date Taking? Authorizing Provider  acetaminophen (TYLENOL) 500 MG tablet Take 500 mg by mouth every 4 (four) hours as needed (pain).   Yes [provider]  esomeprazole (NEXIUM) 20 MG capsule Take 20 mg by mouth daily at 12 noon.   Yes [provider]  ibuprofen (ADVIL) 200 MG tablet Take 400 mg by mouth every 4 (four) hours as needed (pain).   Yes [provider]  MELATONIN PO Take 2 tablets by mouth at bedtime as needed (sleep).   Yes [provider]  Trolamine Salicylate (ASPERCREME EX) Apply 1 Application topically daily as needed (pain).   Yes [provider]     Vital Signs: BP (!) 163/77   Pulse 92   Temp 98.8 F (37.1 C) (Oral)   Resp 20   Ht '5\' 6"'$  (1.676 m)   Wt 118 lb 2.7 oz (53.6 kg)   SpO2 91%   BMI 19.07 kg/m   Physical Exam Constitutional:      General: She is not in acute distress.    Appearance: She is ill-appearing.  Cardiovascular:     Rate and Rhythm: Normal rate and regular rhythm.     Pulses: Normal pulses.     Heart sounds: Normal heart sounds.  Pulmonary:     Effort: Pulmonary effort is normal.     Breath sounds: Normal breath sounds. No stridor. No wheezing or rales.     Comments: Pt yelps with pain on deep inspiration Abdominal:     Tenderness: There is abdominal tenderness. There is  no guarding or rebound.  Skin:    General: Skin is warm and dry.     Comments: Right PCN tube in place. Insertion site is unremarkable with no erythema, no induration, and no drainage at skin. Attached to gravity bag with ~150 mL of straw-colored fluid in bag. Dressing is clean, dry, and intact  Left PCN tube in place. Insertion site is unremarkable with no erythema, no induration, and no drainage at skin. Attached to gravity bag with ~400 mL of straw-colored fluid with pink tinge in bag. Dressing is clean, dry, and intact  Neurological:     Mental Status: She is alert and oriented to person, place, and time.  Psychiatric:        Mood and Affect: Mood normal.        Behavior: Behavior normal.        Imaging: IR NEPHROSTOMY PLACEMENT LEFT  Result Date: 04/16/2022 INDICATION: 66 year old female presents for left percutaneous nephrostomy EXAM: ULTRASOUND GUIDED VENIPUNCTURE IMAGE GUIDED LEFT-SIDED PERCUTANEOUS NEPHROSTOMY COMPARISON:  None MEDICATIONS: 2 g Ancef; The antibiotic was administered in an appropriate time frame prior to skin puncture. ANESTHESIA/SEDATION: Fentanyl 100 mcg IV; Versed 2.0 mg IV Moderate Sedation Time:  12 minutes The patient was continuously monitored during the procedure by the interventional radiology nurse under my direct supervision. CONTRAST:  30m OMNIPAQUE IOHEXOL  300 MG/ML SOLN - administered into the collecting system(s) FLUOROSCOPY TIME:  Fluoroscopy Time: 0 minutes 48 seconds (2 mGy). COMPLICATIONS: None PROCEDURE: Informed written consent was obtained from the patient after a thorough discussion of the procedural risks, benefits and alternatives. All questions were addressed. Maximal Sterile Barrier Technique was utilized including caps, mask, sterile gowns, sterile gloves, sterile drape, hand hygiene and skin antiseptic. A timeout was performed prior to the initiation of the procedure. Ultrasound survey of the right upper extremity was performed with images  stored and sent to PACs. A micropuncture needle was used access the right brachial vein under ultrasound. With venous blood flow returned, an .018 micro wire was passed through the needle into the vein. The needle was removed, and a micropuncture sheath was placed over the wire. The inner dilator and wire were removed, and the catheter was secured in position after attaching to saline flush. We then proceeded with percutaneous nephrostomy. Patient positioned prone position on the fluoroscopy table. Ultrasound survey of the left flank was performed with images stored and sent to PACs. The patient was then prepped and draped in the usual sterile fashion. 1% lidocaine was used to anesthetize the skin and subcutaneous tissues for local anesthesia. A Chiba needle was then used to access a posterior inferior calyx with ultrasound guidance. With spontaneous urine returned through the needle, passage of an 018 micro wire into the collecting system was performed under fluoroscopy. A small incision was made with an 11 blade scalpel, and the needle was removed from the wire. An Accustick system was then advanced over the wire into the collecting system under fluoroscopy. The metal stiffener and inner dilator were removed, and then a sample of fluid was aspirated through the 4 French outer sheath. Bentson wire was passed into the collecting system and the sheath removed. Ten French dilation of the soft tissues was performed. Using modified Seldinger technique, a 10 French pigtail catheter drain was placed over the Bentson wire. Wire and inner stiffener removed, and the pigtail was formed in the collecting system. Small amount of contrast confirmed position of the catheter. Patient tolerated the procedure well and remained hemodynamically stable throughout. No complications were encountered and no significant blood loss encountered IMPRESSION: Status post ultrasound-guided right upper extremity venipuncture, image guided left  percutaneous nephrostomy. Signed, Dulcy Fanny. Nadene Rubins, RPVI [facility] Inter[facility] Rad[facility] Radiology Electronically Signed   By: Corrie Mckusick D.O.   On: 04/16/2022 08:54   IR RADIOLOGY PERIPHERAL GUIDED IV START  Result Date: 04/16/2022 INDICATION: 66 year old female presents for left percutaneous nephrostomy EXAM: ULTRASOUND GUIDED VENIPUNCTURE IMAGE GUIDED LEFT-SIDED PERCUTANEOUS NEPHROSTOMY COMPARISON:  None MEDICATIONS: 2 g Ancef; The antibiotic was administered in an appropriate time frame prior to skin puncture. ANESTHESIA/SEDATION: Fentanyl 100 mcg IV; Versed 2.0 mg IV Moderate Sedation Time:  12 minutes The patient was continuously monitored during the procedure by the interventional radiology nurse under my direct supervision. CONTRAST:  98m OMNIPAQUE IOHEXOL 300 MG/ML SOLN - administered into the collecting system(s) FLUOROSCOPY TIME:  Fluoroscopy Time: 0 minutes 48 seconds (2 mGy). COMPLICATIONS: None PROCEDURE: Informed written consent was obtained from the patient after a thorough discussion of the procedural risks, benefits and alternatives. All questions were addressed. Maximal Sterile Barrier Technique was utilized including caps, mask, sterile gowns, sterile gloves, sterile drape, hand hygiene and skin antiseptic. A timeout was performed prior to the initiation of the procedure. Ultrasound survey of the right upper extremity was performed with images stored and sent  to PACs. A micropuncture needle was used access the right brachial vein under ultrasound. With venous blood flow returned, an .018 micro wire was passed through the needle into the vein. The needle was removed, and a micropuncture sheath was placed over the wire. The inner dilator and wire were removed, and the catheter was secured in position after attaching to saline flush. We then proceeded with percutaneous nephrostomy. Patient positioned prone position on the fluoroscopy table. Ultrasound  survey of the left flank was performed with images stored and sent to PACs. The patient was then prepped and draped in the usual sterile fashion. 1% lidocaine was used to anesthetize the skin and subcutaneous tissues for local anesthesia. A Chiba needle was then used to access a posterior inferior calyx with ultrasound guidance. With spontaneous urine returned through the needle, passage of an 018 micro wire into the collecting system was performed under fluoroscopy. A small incision was made with an 11 blade scalpel, and the needle was removed from the wire. An Accustick system was then advanced over the wire into the collecting system under fluoroscopy. The metal stiffener and inner dilator were removed, and then a sample of fluid was aspirated through the 4 French outer sheath. Bentson wire was passed into the collecting system and the sheath removed. Ten French dilation of the soft tissues was performed. Using modified Seldinger technique, a 10 French pigtail catheter drain was placed over the Bentson wire. Wire and inner stiffener removed, and the pigtail was formed in the collecting system. Small amount of contrast confirmed position of the catheter. Patient tolerated the procedure well and remained hemodynamically stable throughout. No complications were encountered and no significant blood loss encountered IMPRESSION: Status post ultrasound-guided right upper extremity venipuncture, image guided left percutaneous nephrostomy. Signed, Dulcy Fanny. Nadene Rubins, RPVI Vascular and Interventional Radiology Specialists [facility] Radiology Electronically Signed   By: Corrie Mckusick D.O.   On: 04/16/2022 08:54   IR US Guide Vasc Access Right  Result Date: 04/16/2022 INDICATION: 66 year old female presents for left percutaneous nephrostomy EXAM: ULTRASOUND GUIDED VENIPUNCTURE IMAGE GUIDED LEFT-SIDED PERCUTANEOUS NEPHROSTOMY COMPARISON:  None MEDICATIONS: 2 g Ancef; The antibiotic was administered in an  appropriate time frame prior to skin puncture. ANESTHESIA/SEDATION: Fentanyl 100 mcg IV; Versed 2.0 mg IV Moderate Sedation Time:  12 minutes The patient was continuously monitored during the procedure by the interventional radiology nurse under my direct supervision. CONTRAST:  88m OMNIPAQUE IOHEXOL 300 MG/ML SOLN - administered into the collecting system(s) FLUOROSCOPY TIME:  Fluoroscopy Time: 0 minutes 48 seconds (2 mGy). COMPLICATIONS: None PROCEDURE: Informed written consent was obtained from the patient after a thorough discussion of the procedural risks, benefits and alternatives. All questions were addressed. Maximal Sterile Barrier Technique was utilized including caps, mask, sterile gowns, sterile gloves, sterile drape, hand hygiene and skin antiseptic. A timeout was performed prior to the initiation of the procedure. Ultrasound survey of the right upper extremity was performed with images stored and sent to PACs. A micropuncture needle was used access the right brachial vein under ultrasound. With venous blood flow returned, an .018 micro wire was passed through the needle into the vein. The needle was removed, and a micropuncture sheath was placed over the wire. The inner dilator and wire were removed, and the catheter was secured in position after attaching to saline flush. We then proceeded with percutaneous nephrostomy. Patient positioned prone position on the fluoroscopy table. Ultrasound survey of the left flank was performed with images stored and sent to  PACs. The patient was then prepped and draped in the usual sterile fashion. 1% lidocaine was used to anesthetize the skin and subcutaneous tissues for local anesthesia. A Chiba needle was then used to access a posterior inferior calyx with ultrasound guidance. With spontaneous urine returned through the needle, passage of an 018 micro wire into the collecting system was performed under fluoroscopy. A small incision was made with an 11 blade  scalpel, and the needle was removed from the wire. An Accustick system was then advanced over the wire into the collecting system under fluoroscopy. The metal stiffener and inner dilator were removed, and then a sample of fluid was aspirated through the 4 French outer sheath. Bentson wire was passed into the collecting system and the sheath removed. Ten French dilation of the soft tissues was performed. Using modified Seldinger technique, a 10 French pigtail catheter drain was placed over the Bentson wire. Wire and inner stiffener removed, and the pigtail was formed in the collecting system. Small amount of contrast confirmed position of the catheter. Patient tolerated the procedure well and remained hemodynamically stable throughout. No complications were encountered and no significant blood loss encountered IMPRESSION: Status post ultrasound-guided right upper extremity venipuncture, image guided left percutaneous nephrostomy. Signed, Dulcy Fanny. Nadene Rubins, RPVI Vascular and Interventional Radiology Specialists [facility] Radiology Electronically Signed   By: Corrie Mckusick D.O.   On: 04/16/2022 08:54   CT ABDOMEN PELVIS WO CONTRAST  Result Date: 04/14/2022 CLINICAL DATA:  66 year old female with abdominal and pelvic pain. Patient with acute kidney injury, urinary infection and bilateral hydronephrosis. RIGHT percutaneous nephrostomy tube placed on 04/12/2022. EXAM: CT ABDOMEN AND PELVIS WITHOUT CONTRAST TECHNIQUE: Multidetector CT imaging of the abdomen and pelvis was performed following the standard protocol without IV contrast. RADIATION DOSE REDUCTION: This exam was performed according to the departmental dose-optimization program which includes automated exposure control, adjustment of the mA and/or kV according to patient size and/or use of iterative reconstruction technique. COMPARISON:  None Available. FINDINGS: Please note that parenchymal and vascular abnormalities may be missed as intravenous  contrast was not administered. Lower chest: A small RIGHT pleural effusion, trace LEFT pleural effusion and mild bibasilar atelectasis noted. Hepatobiliary: Liver is unremarkable. Vicarious excretion of contrast in the gallbladder is noted without other definite gallbladder abnormality. There is no evidence of intrahepatic or extrahepatic biliary dilatation. Pancreas: Unremarkable Spleen: Unremarkable Adrenals/Urinary Tract: A RIGHT percutaneous nephrostomy catheter is noted with tip in the RIGHT renal pelvis. A tiny focus of gas within the anterior calyx is noted. Moderate LEFT hydroureteronephrosis to the bladder and RIGHT hydroureter again identified. New mild LEFT perinephric inflammation and small to moderate amount of fluid in the LEFT pararenal spaces noted. A Foley catheter is noted within a collapsed bladder. No urinary calculi are identified. Stomach/Bowel: Stomach is within normal limits. No evidence of bowel wall thickening, distention, or inflammatory changes. No bowel obstruction identified. Vascular/Lymphatic: Aortic atherosclerosis. No enlarged abdominal or pelvic lymph nodes. Reproductive: Unremarkable Other: No ascites, focal collection or pneumoperitoneum. Musculoskeletal: No acute or suspicious bony abnormalities are noted. IMPRESSION: 1. Unchanged moderate LEFT hydroureteronephrosis to the bladder again noted but new mild LEFT perinephric inflammation and small to moderate amount of LEFT pararenal space fluid, of uncertain clinical significance but may represent increasing infection/inflammation. The pancreas is unremarkable but correlate with labs. 2. RIGHT percutaneous nephrostomy catheter with tip in the RIGHT renal pelvis. Unchanged RIGHT hydroureter. 3. New small RIGHT pleural effusion, trace LEFT pleural effusion and mild bibasilar atelectasis. 4. Aortic  Atherosclerosis (ICD10-I70.0). Electronically Signed   By: Margarette Canada M.D.   On: 04/14/2022 13:42   DG Abd 1 View  Result Date:  04/13/2022 CLINICAL DATA:  [id number] generalized abdominal pain. Right nephrostomy tube. EXAM: ABDOMEN - 1 VIEW COMPARISON:  April 10, 2022 FINDINGS: There has been interval right PCN tube insertion. There is mild gaseous distention of the colon seen and has mildly increased in the interim. Bowel-gas pattern is nonobstructive. Phleboliths in the pelvis. IMPRESSION: Right PCN tube and is new. Mild gaseous distention of the colon and has increased in the interim without evidence of bowel obstruction. Electronically Signed   By: Frazier Richards M.D.   On: 04/13/2022 15:14   IR NEPHROSTOMY PLACEMENT RIGHT  Result Date: 04/12/2022 CLINICAL DATA:  Bilateral hydronephrosis, right greater than left. Acute kidney injury and urinary infection. Request for right percutaneous nephrostomy tube. EXAM: 1. ULTRASOUND GUIDANCE FOR PUNCTURE OF THE RIGHT RENAL COLLECTING SYSTEM. 2. Right PERCUTANEOUS NEPHROSTOMY TUBE PLACEMENT. COMPARISON:  Renal ultrasound on 04/10/2022 and CT of the abdomen and pelvis on 04/06/2022. ANESTHESIA/SEDATION: Moderate (conscious) sedation was employed during this procedure. A total of Versed 2.0 mg and Fentanyl 100 mcg was administered intravenously by radiology nursing. Moderate Sedation Time: 14 minutes. The patient's level of consciousness and vital signs were monitored continuously by radiology nursing throughout the procedure under my direct supervision. CONTRAST:  Ten ml Omnipaque 300 MEDICATIONS: No additional medications. FLUOROSCOPY TIME:  3.0 mGy. PROCEDURE: The procedure, risks, benefits, and alternatives were explained to the patient. Questions regarding the procedure were encouraged and answered. The patient understands and consents to the procedure. A time-out was performed prior to initiating the procedure. The right flank region was prepped with chlorhexidine in a sterile fashion, and a sterile drape was applied covering the operative field. A sterile gown and sterile gloves were used for  the procedure. Local anesthesia was provided with 1% Lidocaine. Ultrasound was used to localize the right kidney. Under direct ultrasound guidance, a 21 gauge needle was advanced into the renal collecting system. Ultrasound image documentation was performed. Aspiration of urine sample was performed followed by contrast injection. A transitional dilator was advanced over a guidewire. Percutaneous tract dilatation was then performed over the guidewire. A 10-French percutaneous nephrostomy tube was then advanced and formed in the collecting system. Catheter position was confirmed by fluoroscopy after contrast injection. The catheter was attached to a gravity drainage bag and secured at the skin with a Prolene retention suture. COMPLICATIONS: None. FINDINGS: Ultrasound demonstrates moderate hydronephrosis of the right kidney. A nephrostomy tube was placed via lower pole caliceal access and formed at the level of the renal pelvis. There is return of blood tinged urine. A urine sample was sent for culture analysis. IMPRESSION: Right percutaneous nephrostomy tube placement. A 10 French catheter was placed and formed at the level of the renal pelvis. This was attached to gravity bag drainage. A urine sample was sent for culture analysis. Electronically Signed   By: Aletta Edouard M.D.   On: 04/12/2022 15:27    Labs:  CBC: Recent Labs    04/13/22 0618 04/14/22 0807 04/15/22 0528 04/16/22 0453  WBC 15.3* 14.4* 14.8* 17.1*  HGB 11.5* 11.6* 11.5* 11.0*  HCT 33.2* 34.4* 35.0* 33.3*  PLT 499* 440* 451* 420*     COAGS: Recent Labs    04/12/22 0605  INR 1.1     BMP: Recent Labs    04/13/22 1420 04/14/22 0807 04/14/22 1724 04/15/22 0528 04/16/22 0453  NA 139 136  --  135 137  K 3.5 2.6* 3.9 3.8 3.2*  CL 102 94*  --  98 99  CO2 22 27  --  24 24  GLUCOSE 96 90  --  81 83  BUN 6* 5*  --  5* 5*  CALCIUM 8.2* 7.8*  --  8.0* 7.9*  CREATININE 0.87 0.66  --  0.73 0.57  GFRNONAA >60 >60  --  >60 >60      LIVER FUNCTION TESTS: Recent Labs    04/06/22 0555 04/07/22 0629 04/10/22 0541 04/14/22 1436  BILITOT 1.1 0.4  --  0.9  AST 26 14*  --  16  ALT 10 9  --  10  ALKPHOS 78 75  --  59  PROT 7.5 7.2  --  6.6  ALBUMIN 3.8 3.7 3.1* 3.2*     Assessment and Plan:  Betty [name] is a 66 yo female with bilateral hydroureteronephrosis s/p R PCN placement by Dr Kathlene Cote on 04/12/22 and L PCN placement on 7/31 with Dr Earleen Newport. Renal function has improved after R PCN placement. Patient today is complaining of bilateral flank pain as well as generalized abdominal pain, but states these are better than yesterday.     Plan: Continue TID flushes with 5 cc NS. Record output Q shift. Dressing changes QD or PRN if soiled.  Call IR APP or on call IR MD if difficulty flushing or sudden change in drain output.  Repeat imaging/possible drain injection once output < 10 mL/QD (excluding flush material). Consideration for drain removal if output is < 10 mL/QD (excluding flush material), pending discussion with the providing surgical service.  Discharge planning: Please contact IR APP or on call IR MD prior to patient d/c to ensure appropriate follow up plans are in place. Typically patient will follow up with IR clinic 10-14 days post d/c for repeat imaging/possible drain injection. IR scheduler will contact patient with date/time of appointment. Patient will need to flush drain QD with 5 cc NS, record output QD, dressing changes every 2-3 days or earlier if soiled.   IR will continue to follow - please call with questions or concerns.       Electronically Signed: Lura Em, PA-C 04/16/2022, 11:30 AM   I spent a total of 15 Minutes at the the patient's bedside AND on the patient's hospital floor or unit, greater than 50% of which was counseling/coordinating care for Bilateral hydroureteronephrosis s/p R PCN placement by Dr Kathlene Cote on 04/12/22 and L PCN placement by Dr Earleen Newport on  7/31

## 2022-04-16 NOTE — Progress Notes (Signed)
Was at IR last night when I came up and I spoke to nurse re pain, etc.- commented re pain issues ans requests for pain meds are regular  Right sided pressure was relieved by rt perc tube Then developed severe left flank pressure much like original left pressure- this was relieved by left perc tube Still is sore around both perc sites  Has continue to have diffuse low abd pain entire lower half  No BM and gas pos  WBC elevated today CR normal Belly soft  Will continue to follow   Suggest to consult ID to see if they can explain the ? Pyelonephritis with negative cultures

## 2022-04-16 NOTE — Care Management Important Message (Signed)
Important Message  Patient Details IM Letter given to the Patient. Name: Betty [name] MRN: [id number] Date of Birth: 1956/04/18   Medicare Important Message Given:  Yes     Kerin Salen 04/16/2022, 9:38 AM

## 2022-04-16 NOTE — Progress Notes (Addendum)
PROGRESS NOTE    Betty [name]  YSH:[id number] DOB: [birth year] DOA: 04/06/2022 PCP: Pcp, No   Brief Narrative: 66 year old with past medical history significant for hypertension, tobacco abuse, EtOH abuse, GERD presenting with right-sided flank pain.  She reports her symptoms began with incontinence 2 months ago.  She had a few episodes and became concerned.  She went to urgent care.  Nothing was found on work-up.  She was sent home with 7 days of antibiotics.  Her symptoms did not improve.  She is also complaining of intermittent right flank pain that got worse over the week.  She presented with worsening symptoms to the ED.  CT abdomen and pelvis: Mild bilateral hydroureteronephrosis.  Both ureters are dilated down to the pelvis just proximal to the UVJ on each side where the ureteral urothelium may hyper enhance.  This is associated with circumferential bladder wall thickening suggestive of cystitis.  Marked: Low-density material measuring 6 mm in thickness surrounding the proximal and right mid ureter.  This probably reflects marked edema of the ureteral wall although contained.  Ureteric edema or lymphoma cannot be excluded.  Initially ED discussed with urology who recommend IV fluids and antibiotics.  Patient continue to have flank pain, renal function without significant improvement.  Urology was  formally consulted. IV antibiotics resume. Underwent Nephrostomy tube placement 7/28. Renal function improved.   Patient develops left flank pain, severe similar to right side flank pain on admission. CT scan repeated, showed unchanged left side hydroureteronephrosis, new left perinephric inflammatory changes.  Case discussed with Dr. Louis Meckel with urology he recommended placing a left side percutaneous nephrostomy tube to see if that will help with the pain. Underwent left side perc nephrostomy tube placement 7/31.  ID consulted.      Assessment & Plan:   Principal Problem:   AKI (acute kidney  injury) ([place]) Active Problems:   Cystitis   Hydroureteronephrosis   Tobacco abuse   Alcohol abuse   Hyponatremia   Hypokalemia  1-AKI: In the setting of bilateral hydroureteronephrosis Unclear etiology, urology considering retroperitoneal fibrosis, Pyelonephritis.  -No prior renal function records. Presented with a creatinine of 2.3 -Treated  with IV fluids. -Renal ultrasound: Moderate hydronephrosis and bilateral proximal hydroureter. Ureteral jets in the urinary bladder were not identified during a five-minute observation. -Urology recommends continuation of IV antibiotics, IV fluids, follow up out patient. Subsequently urology recommended right side nephrostomy tube placement. -Foley catheter placed 7/27. -Underwent right percutaneous nephrostomy tube placement by IR 7/28. -Renal function improved.  -Patient started to complain of left side flank pain, continue to have abdominal pain.  -Repeated CT scan 7/30 ; showed unchanged left hydroureteronephrosis. New left perinephric inflammation, small to moderate amount left para renal space fluid.  -Discussed with urology, recommend IR evaluation for perc left nephrostomy tube placement.  -on robaxin PRN for pain. Oxycodone PRN -Checking   IgG 4: 11 not significantly elevated. ESR: 50  to evaluate for retroperitoneal fibrosis.  -Underwent left side perc nephrostomy tube placement 7/31. Plan to Consult ID>   2-Anion gap metabolic acidosis: In the setting of AKI.  Continue with IV fluids Continue with  sodium bicarb tablet. Change to BID>  Resolved.   Leukocytosis;  On ceftriaxone. Culture negative.  ID consulted.   Hypokalemia: oral potassium ordered.  Hypomagnesemia; Replete IV Hyponatremia: Resolved Alcohol abuse: Counseling provided CIWA protocol  Tobacco abuse: Continue with nicotine patch  Hydroureteronephrosis: Urology consulted. Underwent right side percutaneous nephrostomy tube.    Constipation; KUB negative. Started  on laxative.  Continue to have abdominal pain.  KUB: mild gaseous distension of colon.  Order dulcolax suppository  Hypertension:  Continue with Norvasc.  Started  low dose metoprolol.  IV hydralazine PRN  Mild hypocalcemia; replete orally.   Estimated body mass index is 19.07 kg/m as calculated from the following:   Height as of this encounter: 5' 6"  (1.676 m).   Weight as of this encounter: 53.6 kg.   DVT prophylaxis: Heparin  Code Status: DNR Family Communication: Care discussed with patient brother over phone 8/01. Disposition Plan:  Status is: Inpatient Remains inpatient appropriate because: Remain inpatient for management of hydroureteronephrosis.     Consultants:  Urology  Procedures:  Renal US.   Antimicrobials:    Subjective: She appears better today, report still having pain left side . She appears with less pain.   Objective: Vitals:   04/15/22 1754 04/15/22 1829 04/16/22 0649 04/16/22 0941  BP:  138/78 (!) 143/87 (!) 163/77  Pulse: (!) 109 (!) 105 87 92  Resp: 14 17 20    Temp:  98 F (36.7 C) 98.8 F (37.1 C)   TempSrc:  Oral Oral   SpO2: 98% 94% 91%   Weight:      Height:        Intake/Output Summary (Last 24 hours) at 04/16/2022 1352 Last data filed at 04/16/2022 1130 Gross per 24 hour  Intake 1756.19 ml  Output 1850 ml  Net -93.81 ml    Filed Weights   04/06/22 1428  Weight: 53.6 kg    Examination:  General exam: NAD Respiratory system: CTA Cardiovascular system:  S,1 S 2 RRR Gastrointestinal system: BS present, soft,  Nephrostomy tube in place.  Central nervous system: Alert, follows command Extremities: No edema  Data Reviewed: I have personally reviewed following labs and imaging studies  CBC: Recent Labs  Lab 04/12/22 0810 04/13/22 0618 04/14/22 0807 04/15/22 0528 04/16/22 0453  WBC 8.4 15.3* 14.4* 14.8* 17.1*  HGB 13.3 11.5* 11.6* 11.5* 11.0*  HCT 41.0 33.2* 34.4* 35.0* 33.3*  MCV 94.7 96.2 97.5 99.4 100.0  PLT  281 499* 440* 451* 420*    Basic Metabolic Panel: Recent Labs  Lab 04/10/22 0541 04/10/22 0640 04/11/22 0609 04/13/22 0618 04/13/22 1420 04/14/22 0807 04/14/22 1724 04/15/22 0528 04/16/22 0453  NA 143  --    < > 140 139 136  --  135 137  K 3.3*  --    < > 2.1* 3.5 2.6* 3.9 3.8 3.2*  CL 108  --    < > 97* 102 94*  --  98 99  CO2 19*  --    < > 24 22 27   --  24 24  GLUCOSE 84  --    < > 96 96 90  --  81 83  BUN 16  --    < > 8 6* 5*  --  5* 5*  CREATININE 2.30*  --    < > 0.98 0.87 0.66  --  0.73 0.57  CALCIUM 8.9  --    < > 8.2* 8.2* 7.8*  --  8.0* 7.9*  MG  --  1.8  --  1.2*  --  1.3*  --  1.5* 1.8  PHOS 5.0*  --   --   --   --   --   --   --   --    < > = values in this interval not displayed.    GFR: Estimated Creatinine Clearance: 59.3 mL/min (  by C-G formula based on SCr of 0.57 mg/dL). Liver Function Tests: Recent Labs  Lab 04/10/22 0541 04/14/22 1436  AST  --  16  ALT  --  10  ALKPHOS  --  59  BILITOT  --  0.9  PROT  --  6.6  ALBUMIN 3.1* 3.2*    Recent Labs  Lab 04/15/22 0528  LIPASE 33    No results for input(s): "AMMONIA" in the last 168 hours. Coagulation Profile: Recent Labs  Lab 04/12/22 0605  INR 1.1    Cardiac Enzymes: No results for input(s): "CKTOTAL", "CKMB", "CKMBINDEX", "TROPONINI" in the last 168 hours. BNP (last 3 results) No results for input(s): "PROBNP" in the last 8760 hours. HbA1C: No results for input(s): "HGBA1C" in the last 72 hours. CBG: No results for input(s): "GLUCAP" in the last 168 hours. Lipid Profile: No results for input(s): "CHOL", "HDL", "LDLCALC", "TRIG", "CHOLHDL", "LDLDIRECT" in the last 72 hours. Thyroid Function Tests: No results for input(s): "TSH", "T4TOTAL", "FREET4", "T3FREE", "THYROIDAB" in the last 72 hours. Anemia Panel: No results for input(s): "VITAMINB12", "FOLATE", "FERRITIN", "TIBC", "IRON", "RETICCTPCT" in the last 72 hours. Sepsis Labs: No results for input(s): "PROCALCITON",  "LATICACIDVEN" in the last 168 hours.  Recent Results (from the past 240 hour(s))  Urine Culture     Status: None   Collection Time: 04/12/22  3:01 PM   Specimen: Urine, Random  Result Value Ref Range Status   Specimen Description   Final    URINE, RANDOM Performed at [facility] [address] [place], [place] [address]    Special Requests   Final    RIGHT KIDNEY Performed at [facility], [place] [address] [place], [place] [address]    Culture   Final    NO GROWTH Performed at [facility], [place] [address], [place], [place] [address]    Report Status 04/13/2022 FINAL  Final         Radiology Studies: IR NEPHROSTOMY PLACEMENT LEFT  Result Date: 04/16/2022 INDICATION: 66 year old female presents for left percutaneous nephrostomy EXAM: ULTRASOUND GUIDED VENIPUNCTURE IMAGE GUIDED LEFT-SIDED PERCUTANEOUS NEPHROSTOMY COMPARISON:  None MEDICATIONS: 2 g Ancef; The antibiotic was administered in an appropriate time frame prior to skin puncture. ANESTHESIA/SEDATION: Fentanyl 100 mcg IV; Versed 2.0 mg IV Moderate Sedation Time:  12 minutes The patient was continuously monitored during the procedure by the interventional radiology nurse under my direct supervision. CONTRAST:  22m OMNIPAQUE IOHEXOL 300 MG/ML SOLN - administered into the collecting system(s) FLUOROSCOPY TIME:  Fluoroscopy Time: 0 minutes 48 seconds (2 mGy). COMPLICATIONS: None PROCEDURE: Informed written consent was obtained from the patient after a thorough discussion of the procedural risks, benefits and alternatives. All questions were addressed. Maximal Sterile Barrier Technique was utilized including caps, mask, sterile gowns, sterile gloves, sterile drape, hand hygiene and skin antiseptic. A timeout was performed prior to the initiation of the procedure. Ultrasound survey of the right upper extremity was performed with images stored and sent to PACs. A micropuncture needle was used  access the right brachial vein under ultrasound. With venous blood flow returned, an .018 micro wire was passed through the needle into the vein. The needle was removed, and a micropuncture sheath was placed over the wire. The inner dilator and wire were removed, and the catheter was secured in position after attaching to saline flush. We then proceeded with percutaneous nephrostomy. Patient positioned prone position on the fluoroscopy table. Ultrasound survey of the left flank was performed with  images stored and sent to PACs. The patient was then prepped and draped in the usual sterile fashion. 1% lidocaine was used to anesthetize the skin and subcutaneous tissues for local anesthesia. A Chiba needle was then used to access a posterior inferior calyx with ultrasound guidance. With spontaneous urine returned through the needle, passage of an 018 micro wire into the collecting system was performed under fluoroscopy. A small incision was made with an 11 blade scalpel, and the needle was removed from the wire. An Accustick system was then advanced over the wire into the collecting system under fluoroscopy. The metal stiffener and inner dilator were removed, and then a sample of fluid was aspirated through the 4 French outer sheath. Bentson wire was passed into the collecting system and the sheath removed. Ten French dilation of the soft tissues was performed. Using modified Seldinger technique, a 10 French pigtail catheter drain was placed over the Bentson wire. Wire and inner stiffener removed, and the pigtail was formed in the collecting system. Small amount of contrast confirmed position of the catheter. Patient tolerated the procedure well and remained hemodynamically stable throughout. No complications were encountered and no significant blood loss encountered IMPRESSION: Status post ultrasound-guided right upper extremity venipuncture, image guided left percutaneous nephrostomy. Signed, Dulcy Fanny. Nadene Rubins, RPVI [facility] Radiology Electronically Signed   By: Corrie Mckusick D.O.   On: 04/16/2022 08:54   IR RADIOLOGY PERIPHERAL GUIDED IV START  Result Date: 04/16/2022 INDICATION: 66 year old female presents for left percutaneous nephrostomy EXAM: ULTRASOUND GUIDED VENIPUNCTURE IMAGE GUIDED LEFT-SIDED PERCUTANEOUS NEPHROSTOMY COMPARISON:  None MEDICATIONS: 2 g Ancef; The antibiotic was administered in an appropriate time frame prior to skin puncture. ANESTHESIA/SEDATION: Fentanyl 100 mcg IV; Versed 2.0 mg IV Moderate Sedation Time:  12 minutes The patient was continuously monitored during the procedure by the interventional radiology nurse under my direct supervision. CONTRAST:  7m OMNIPAQUE IOHEXOL 300 MG/ML SOLN - administered into the collecting system(s) FLUOROSCOPY TIME:  Fluoroscopy Time: 0 minutes 48 seconds (2 mGy). COMPLICATIONS: None PROCEDURE: Informed written consent was obtained from the patient after a thorough discussion of the procedural risks, benefits and alternatives. All questions were addressed. Maximal Sterile Barrier Technique was utilized including caps, mask, sterile gowns, sterile gloves, sterile drape, hand hygiene and skin antiseptic. A timeout was performed prior to the initiation of the procedure. Ultrasound survey of the right upper extremity was performed with images stored and sent to PACs. A micropuncture needle was used access the right brachial vein under ultrasound. With venous blood flow returned, an .018 micro wire was passed through the needle into the vein. The needle was removed, and a micropuncture sheath was placed over the wire. The inner dilator and wire were removed, and the catheter was secured in position after attaching to saline flush. We then proceeded with percutaneous nephrostomy. Patient positioned prone position on the fluoroscopy table. Ultrasound survey of the left flank was performed with images stored  and sent to PACs. The patient was then prepped and draped in the usual sterile fashion. 1% lidocaine was used to anesthetize the skin and subcutaneous tissues for local anesthesia. A Chiba needle was then used to access a posterior inferior calyx with ultrasound guidance. With spontaneous urine returned through the needle, passage of an 018 micro wire into the collecting system was performed under fluoroscopy. A small incision was made with an 11 blade scalpel, and the needle was removed from the wire. An Accustick system was then  advanced over the wire into the collecting system under fluoroscopy. The metal stiffener and inner dilator were removed, and then a sample of fluid was aspirated through the 4 French outer sheath. Bentson wire was passed into the collecting system and the sheath removed. Ten French dilation of the soft tissues was performed. Using modified Seldinger technique, a 10 French pigtail catheter drain was placed over the Bentson wire. Wire and inner stiffener removed, and the pigtail was formed in the collecting system. Small amount of contrast confirmed position of the catheter. Patient tolerated the procedure well and remained hemodynamically stable throughout. No complications were encountered and no significant blood loss encountered IMPRESSION: Status post ultrasound-guided right upper extremity venipuncture, image guided left percutaneous nephrostomy. Signed, Dulcy Fanny. Nadene Rubins, RPVI [facility] Radiology Electronically Signed   By: Corrie Mckusick D.O.   On: 04/16/2022 08:54   IR US Guide Vasc Access Right  Result Date: 04/16/2022 INDICATION: 66 year old female presents for left percutaneous nephrostomy EXAM: ULTRASOUND GUIDED VENIPUNCTURE IMAGE GUIDED LEFT-SIDED PERCUTANEOUS NEPHROSTOMY COMPARISON:  None MEDICATIONS: 2 g Ancef; The antibiotic was administered in an appropriate time frame prior to skin puncture. ANESTHESIA/SEDATION:  Fentanyl 100 mcg IV; Versed 2.0 mg IV Moderate Sedation Time:  12 minutes The patient was continuously monitored during the procedure by the interventional radiology nurse under my direct supervision. CONTRAST:  86m OMNIPAQUE IOHEXOL 300 MG/ML SOLN - administered into the collecting system(s) FLUOROSCOPY TIME:  Fluoroscopy Time: 0 minutes 48 seconds (2 mGy). COMPLICATIONS: None PROCEDURE: Informed written consent was obtained from the patient after a thorough discussion of the procedural risks, benefits and alternatives. All questions were addressed. Maximal Sterile Barrier Technique was utilized including caps, mask, sterile gowns, sterile gloves, sterile drape, hand hygiene and skin antiseptic. A timeout was performed prior to the initiation of the procedure. Ultrasound survey of the right upper extremity was performed with images stored and sent to PACs. A micropuncture needle was used access the right brachial vein under ultrasound. With venous blood flow returned, an .018 micro wire was passed through the needle into the vein. The needle was removed, and a micropuncture sheath was placed over the wire. The inner dilator and wire were removed, and the catheter was secured in position after attaching to saline flush. We then proceeded with percutaneous nephrostomy. Patient positioned prone position on the fluoroscopy table. Ultrasound survey of the left flank was performed with images stored and sent to PACs. The patient was then prepped and draped in the usual sterile fashion. 1% lidocaine was used to anesthetize the skin and subcutaneous tissues for local anesthesia. A Chiba needle was then used to access a posterior inferior calyx with ultrasound guidance. With spontaneous urine returned through the needle, passage of an 018 micro wire into the collecting system was performed under fluoroscopy. A small incision was made with an 11 blade scalpel, and the needle was removed from the wire. An Accustick system  was then advanced over the wire into the collecting system under fluoroscopy. The metal stiffener and inner dilator were removed, and then a sample of fluid was aspirated through the 4 French outer sheath. Bentson wire was passed into the collecting system and the sheath removed. Ten French dilation of the soft tissues was performed. Using modified Seldinger technique, a 10 French pigtail catheter drain was placed over the Bentson wire. Wire and inner stiffener removed, and the pigtail was formed in the collecting system. Small amount of contrast confirmed position of the  catheter. Patient tolerated the procedure well and remained hemodynamically stable throughout. No complications were encountered and no significant blood loss encountered IMPRESSION: Status post ultrasound-guided right upper extremity venipuncture, image guided left percutaneous nephrostomy. Signed, Dulcy Fanny. Nadene Rubins, RPVI [facility] Radiology Electronically Signed   By: Corrie Mckusick D.O.   On: 04/16/2022 08:54        Scheduled Meds:  amLODipine  10 mg Oral Daily   calcium carbonate  1 tablet Oral BID WC   Chlorhexidine Gluconate Cloth  6 each Topical Daily   docusate sodium  100 mg Oral BID   folic acid  1 mg Oral Daily   magnesium oxide  400 mg Oral BID   metoprolol tartrate  25 mg Oral BID   multivitamin with minerals  1 tablet Oral Daily   nicotine  21 mg Transdermal Daily   pantoprazole  40 mg Oral Daily   polyethylene glycol  17 g Oral Daily   senna-docusate  1 tablet Oral BID   sodium bicarbonate  650 mg Oral BID   sodium chloride flush  5 mL Intracatheter Q8H   thiamine  100 mg Oral Daily   Or   thiamine  100 mg Intravenous Daily   Continuous Infusions:  cefTRIAXone (ROCEPHIN)  IV 200 mL/hr at 04/15/22 1854   lactated ringers 100 mL/hr at 04/16/22 1348     LOS: 10 days    Time spent: 35 minutes    Gatsby Chismar A Haila Dena, MD [facility]   If  7PM-7AM, please contact night-coverage [email or web address]  04/16/2022, 1:52 PM

## 2022-04-16 NOTE — Plan of Care (Signed)
Pt c/o pain 8-9/10.  PRNs administered per MAR.  Problem: Pain Managment: Goal: General experience of comfort will improve Outcome: Not Progressing

## 2022-04-16 NOTE — Consult Note (Signed)
[place] for Infectious Disease    Date of Admission:  04/06/2022     Reason for Consult: Culture negative pyelonephritis     Referring Physician: Dr Tyrell Antonio  Current antibiotics: Ceftriaxone 04/06/22 -- present   ASSESSMENT:    66 y.o. female admitted with:  Bilateral hydroureteronephrosis and incontinence: Unclear etiology for this presentation, however, her UA on admission was unremarkable and not suggestive of infection and her cultures were negative x 2.  The inflammation noted on CT scan is impressive but I think is unlikely due to infection given negative work up.  Nonetheless, Betty [name] has received 10 days of antibiotic coverage which should adequately treat a bacterial pyelonephritis.  Betty [name] has no history or risk factors that would be considered for GU tuberculosis and as noted no pyuria on UA. Acute kidney injury:  Improved following nephrostomy tube placement. Leukocytosis: Stable and can be elevated in the setting of inflammation.  RECOMMENDATIONS:    Will stop antibiotics today as Betty [name] has received 10 days and infection seems less likely an etiology Agree with work up for other causes that could explain unusual presentation Will follow   Principal Problem:   AKI (acute kidney injury) ([place]) Active Problems:   Cystitis   Hydroureteronephrosis   Tobacco abuse   Alcohol abuse   Hyponatremia   Hypokalemia   MEDICATIONS:    Scheduled Meds:  amLODipine  10 mg Oral Daily   calcium carbonate  1 tablet Oral BID WC   Chlorhexidine Gluconate Cloth  6 each Topical Daily   docusate sodium  100 mg Oral BID   folic acid  1 mg Oral Daily   magnesium oxide  400 mg Oral BID   metoprolol tartrate  25 mg Oral BID   multivitamin with minerals  1 tablet Oral Daily   nicotine  21 mg Transdermal Daily   pantoprazole  40 mg Oral Daily   polyethylene glycol  17 g Oral Daily   senna-docusate  1 tablet Oral BID   sodium bicarbonate  650 mg Oral BID   sodium chloride flush  5  mL Intracatheter Q8H   thiamine  100 mg Oral Daily   Or   thiamine  100 mg Intravenous Daily   Continuous Infusions:  lactated ringers 100 mL/hr at 04/16/22 1348   PRN Meds:.acetaminophen **OR** acetaminophen, hydrALAZINE, HYDROmorphone (DILAUDID) injection, hydrOXYzine, methocarbamol, ondansetron (ZOFRAN) IV, oxyCODONE  HPI:    Betty [name] is a 66 y.o. female with PMHx significant for HTN, tobacco use, GERD presented on 04/06/22 with flank pain.  Betty [name] reports symptoms began approximately 2-3 months ago with incontinence.  Betty [name] reports going to urgent care for evaluation at that time.  Was told there was no signs of an infection but was given antibiotics nonetheless.  Betty [name] was waiting for Medicare benefits to start prior to seeing a new PCP and asking for urology referral for her incontinence which continued to persist.  Shortly before admission Betty [name] began having intermittent flank pain that progressively worsened prompting her to present to the ER.  Betty [name] underwent CT abdomen and pelvis which showed mild bilateral hydroureteronephrosis with both ureters dilated down to the pelvis just proximal to the UVJ on each side.  There was associated bladder wall thickening which could suggest cystitis.  There was also low density material measuring 6 mm in thickness surrounding the proximal and mid right ureter likely reflecting edema of the ureteral wall although contained periureteric edema or lymphoma cannot be excluded.  Betty [name] was subsequently  admitted and started on IV antibiotics with ceftriaxone.  Betty [name] continued to have flank pain and thus urology was formally consulted and patient underwent nephrostomy tube placement on 04/12/2022 with improvement in her renal function.  Betty [name] subsequently developed left flank pain similar to her right sided pain on admission.  A CT scan was repeated which showed continued left-sided hydroureteronephrosis and perinephric inflammatory changes.  Urology recommended and IR  subsequently placed a left-sided percutaneous nephrostomy tube which was done 04/15/2022.  During the course of her incontinence and flank pain, Betty [name] reports no specific dysuria symptoms.  Her urinalysis on admission was negative for nitrites, leukocytes and no bacteria were seen.  Her urine culture was unremarkable.  A repeat urine culture was obtained on 7/28 which was also negative.  Betty [name] continues to have leukocytosis with a WBC of 17.1 today.  Her renal function has subsequently improved following decompression of her hydroureteronephrosis.   Past Medical History:  Diagnosis Date   GERD (gastroesophageal reflux disease)    HTN (hypertension)     Social History   Tobacco Use   Smoking status: Every Day    Packs/day: 2.00    Types: Cigarettes    History reviewed. No pertinent family history.  No Known Allergies  Review of Systems  All other systems reviewed and are negative.  Except as noted above in the HPI.   OBJECTIVE:   Blood pressure 129/70, pulse 81, temperature 98.9 F (37.2 C), temperature source Oral, resp. rate 17, height '5\' 6"'$  (1.676 m), weight 53.6 kg, SpO2 90 %. Body mass index is 19.07 kg/m.  Physical Exam Constitutional:      General: Betty [name] is not in acute distress.    Appearance: Normal appearance.  HENT:     Head: Normocephalic and atraumatic.  Eyes:     Extraocular Movements: Extraocular movements intact.     Conjunctiva/sclera: Conjunctivae normal.  Pulmonary:     Effort: Pulmonary effort is normal. No respiratory distress.  Abdominal:     General: There is no distension.     Palpations: Abdomen is soft.  Genitourinary:    Comments: Bilateral nephrostomy tubes in place.  Musculoskeletal:        General: Normal range of motion.     Cervical back: Normal range of motion and neck supple.     Right lower leg: No edema.     Left lower leg: No edema.  Skin:    General: Skin is warm and dry.  Neurological:     General: No focal deficit present.      Mental Status: Betty [name] is alert and oriented to person, place, and time.  Psychiatric:        Mood and Affect: Mood normal.        Behavior: Behavior normal.      Lab Results: Lab Results  Component Value Date   WBC 17.1 (H) 04/16/2022   HGB 11.0 (L) 04/16/2022   HCT 33.3 (L) 04/16/2022   MCV 100.0 04/16/2022   PLT 420 (H) 04/16/2022    Lab Results  Component Value Date   NA 137 04/16/2022   K 3.2 (L) 04/16/2022   CO2 24 04/16/2022   GLUCOSE 83 04/16/2022   BUN 5 (L) 04/16/2022   CREATININE 0.57 04/16/2022   CALCIUM 7.9 (L) 04/16/2022   GFRNONAA >60 04/16/2022    Lab Results  Component Value Date   ALT 10 04/14/2022   AST 16 04/14/2022   ALKPHOS 59 04/14/2022   BILITOT 0.9 04/14/2022  No results found for: "CRP"     Component Value Date/Time   ESRSEDRATE 51 (H) 04/15/2022 0528    I have reviewed the micro and lab results in Epic.  Imaging: IR NEPHROSTOMY PLACEMENT LEFT  Result Date: 04/16/2022 INDICATION: 66 year old female presents for left percutaneous nephrostomy EXAM: ULTRASOUND GUIDED VENIPUNCTURE IMAGE GUIDED LEFT-SIDED PERCUTANEOUS NEPHROSTOMY COMPARISON:  None MEDICATIONS: 2 g Ancef; The antibiotic was administered in an appropriate time frame prior to skin puncture. ANESTHESIA/SEDATION: Fentanyl 100 mcg IV; Versed 2.0 mg IV Moderate Sedation Time:  12 minutes The patient was continuously monitored during the procedure by the interventional radiology nurse under my direct supervision. CONTRAST:  73m OMNIPAQUE IOHEXOL 300 MG/ML SOLN - administered into the collecting system(s) FLUOROSCOPY TIME:  Fluoroscopy Time: 0 minutes 48 seconds (2 mGy). COMPLICATIONS: None PROCEDURE: Informed written consent was obtained from the patient after a thorough discussion of the procedural risks, benefits and alternatives. All questions were addressed. Maximal Sterile Barrier Technique was utilized including caps, mask, sterile gowns, sterile gloves, sterile drape, hand hygiene and  skin antiseptic. A timeout was performed prior to the initiation of the procedure. Ultrasound survey of the right upper extremity was performed with images stored and sent to PACs. A micropuncture needle was used access the right brachial vein under ultrasound. With venous blood flow returned, an .018 micro wire was passed through the needle into the vein. The needle was removed, and a micropuncture sheath was placed over the wire. The inner dilator and wire were removed, and the catheter was secured in position after attaching to saline flush. We then proceeded with percutaneous nephrostomy. Patient positioned prone position on the fluoroscopy table. Ultrasound survey of the left flank was performed with images stored and sent to PACs. The patient was then prepped and draped in the usual sterile fashion. 1% lidocaine was used to anesthetize the skin and subcutaneous tissues for local anesthesia. A Chiba needle was then used to access a posterior inferior calyx with ultrasound guidance. With spontaneous urine returned through the needle, passage of an 018 micro wire into the collecting system was performed under fluoroscopy. A small incision was made with an 11 blade scalpel, and the needle was removed from the wire. An Accustick system was then advanced over the wire into the collecting system under fluoroscopy. The metal stiffener and inner dilator were removed, and then a sample of fluid was aspirated through the 4 French outer sheath. Bentson wire was passed into the collecting system and the sheath removed. Ten French dilation of the soft tissues was performed. Using modified Seldinger technique, a 10 French pigtail catheter drain was placed over the Bentson wire. Wire and inner stiffener removed, and the pigtail was formed in the collecting system. Small amount of contrast confirmed position of the catheter. Patient tolerated the procedure well and remained hemodynamically stable throughout. No complications  were encountered and no significant blood loss encountered IMPRESSION: Status post ultrasound-guided right upper extremity venipuncture, image guided left percutaneous nephrostomy. Signed, JDulcy Fanny WNadene Rubins RPVI V[facility] Interventional Radiology Specialists [facility] Electronically Signed   By: JCorrie MckusickD.O.   On: 04/16/2022 08:54   IR RADIOLOGY PERIPHERAL GUIDED IV START  Result Date: 04/16/2022 INDICATION: 66year old female presents for left percutaneous nephrostomy EXAM: ULTRASOUND GUIDED VENIPUNCTURE IMAGE GUIDED LEFT-SIDED PERCUTANEOUS NEPHROSTOMY COMPARISON:  None MEDICATIONS: 2 g Ancef; The antibiotic was administered in an appropriate time frame prior to skin puncture. ANESTHESIA/SEDATION: Fentanyl 100 mcg IV; Versed 2.0 mg IV Moderate Sedation Time:  12 minutes The patient was continuously monitored during the procedure by the interventional radiology nurse under my direct supervision. CONTRAST:  44m OMNIPAQUE IOHEXOL 300 MG/ML SOLN - administered into the collecting system(s) FLUOROSCOPY TIME:  Fluoroscopy Time: 0 minutes 48 seconds (2 mGy). COMPLICATIONS: None PROCEDURE: Informed written consent was obtained from the patient after a thorough discussion of the procedural risks, benefits and alternatives. All questions were addressed. Maximal Sterile Barrier Technique was utilized including caps, mask, sterile gowns, sterile gloves, sterile drape, hand hygiene and skin antiseptic. A timeout was performed prior to the initiation of the procedure. Ultrasound survey of the right upper extremity was performed with images stored and sent to PACs. A micropuncture needle was used access the right brachial vein under ultrasound. With venous blood flow returned, an .018 micro wire was passed through the needle into the vein. The needle was removed, and a micropuncture sheath was placed over the wire. The inner dilator and wire were removed, and the catheter was secured in position  after attaching to saline flush. We then proceeded with percutaneous nephrostomy. Patient positioned prone position on the fluoroscopy table. Ultrasound survey of the left flank was performed with images stored and sent to PACs. The patient was then prepped and draped in the usual sterile fashion. 1% lidocaine was used to anesthetize the skin and subcutaneous tissues for local anesthesia. A Chiba needle was then used to access a posterior inferior calyx with ultrasound guidance. With spontaneous urine returned through the needle, passage of an 018 micro wire into the collecting system was performed under fluoroscopy. A small incision was made with an 11 blade scalpel, and the needle was removed from the wire. An Accustick system was then advanced over the wire into the collecting system under fluoroscopy. The metal stiffener and inner dilator were removed, and then a sample of fluid was aspirated through the 4 French outer sheath. Bentson wire was passed into the collecting system and the sheath removed. Ten French dilation of the soft tissues was performed. Using modified Seldinger technique, a 10 French pigtail catheter drain was placed over the Bentson wire. Wire and inner stiffener removed, and the pigtail was formed in the collecting system. Small amount of contrast confirmed position of the catheter. Patient tolerated the procedure well and remained hemodynamically stable throughout. No complications were encountered and no significant blood loss encountered IMPRESSION: Status post ultrasound-guided right upper extremity venipuncture, image guided left percutaneous nephrostomy. Signed, JDulcy Fanny WNadene Rubins RPVI Vascular and Interventional Radiology Specialists [facility] HospitalRad[facility] Electronically Signed   By: JCorrie MckusickD.O.   On: 04/16/2022 08:54   IR [place]Guide Vasc Access Right  Result Date: 04/16/2022 INDICATION: 66year old female presents for left percutaneous nephrostomy EXAM: ULTRASOUND GUIDED  VENIPUNCTURE IMAGE GUIDED LEFT-SIDED PERCUTANEOUS NEPHROSTOMY COMPARISON:  None MEDICATIONS: 2 g Ancef; The antibiotic was administered in an appropriate time frame prior to skin puncture. ANESTHESIA/SEDATION: Fentanyl 100 mcg IV; Versed 2.0 mg IV Moderate Sedation Time:  12 minutes The patient was continuously monitored during the procedure by the interventional radiology nurse under my direct supervision. CONTRAST:  173mOMNIPAQUE IOHEXOL 300 MG/ML SOLN - administered into the collecting system(s) FLUOROSCOPY TIME:  Fluoroscopy Time: 0 minutes 48 seconds (2 mGy). COMPLICATIONS: None PROCEDURE: Informed written consent was obtained from the patient after a thorough discussion of the procedural risks, benefits and alternatives. All questions were addressed. Maximal Sterile Barrier Technique was utilized including caps, mask, sterile gowns, sterile gloves, sterile drape, hand hygiene and skin antiseptic. A  timeout was performed prior to the initiation of the procedure. Ultrasound survey of the right upper extremity was performed with images stored and sent to PACs. A micropuncture needle was used access the right brachial vein under ultrasound. With venous blood flow returned, an .018 micro wire was passed through the needle into the vein. The needle was removed, and a micropuncture sheath was placed over the wire. The inner dilator and wire were removed, and the catheter was secured in position after attaching to saline flush. We then proceeded with percutaneous nephrostomy. Patient positioned prone position on the fluoroscopy table. Ultrasound survey of the left flank was performed with images stored and sent to PACs. The patient was then prepped and draped in the usual sterile fashion. 1% lidocaine was used to anesthetize the skin and subcutaneous tissues for local anesthesia. A Chiba needle was then used to access a posterior inferior calyx with ultrasound guidance. With spontaneous urine returned through the  needle, passage of an 018 micro wire into the collecting system was performed under fluoroscopy. A small incision was made with an 11 blade scalpel, and the needle was removed from the wire. An Accustick system was then advanced over the wire into the collecting system under fluoroscopy. The metal stiffener and inner dilator were removed, and then a sample of fluid was aspirated through the 4 French outer sheath. Bentson wire was passed into the collecting system and the sheath removed. Ten French dilation of the soft tissues was performed. Using modified Seldinger technique, a 10 French pigtail catheter drain was placed over the Bentson wire. Wire and inner stiffener removed, and the pigtail was formed in the collecting system. Small amount of contrast confirmed position of the catheter. Patient tolerated the procedure well and remained hemodynamically stable throughout. No complications were encountered and no significant blood loss encountered IMPRESSION: Status post ultrasound-guided right upper extremity venipuncture, image guided left percutaneous nephrostomy. Signed, Dulcy Fanny. Nadene Rubins, RPVI Vascular and Interventional Radiology Specialists [facility] Radiology Electronically Signed   By: Corrie Mckusick D.O.   On: 04/16/2022 08:54     Imaging independently reviewed in Epic.  [facility] [phone] pager 04/16/2022, 2:33 PM  I have personally spent 80 minutes involved in face-to-face and non-face-to-face activities for this patient on the day of the visit. Professional time spent includes the following activities: Preparing to see the patient (review of tests), Obtaining and/or reviewing separately obtained history (admission/discharge record), Performing a medically appropriate examination and/or evaluation , Ordering medications/tests/procedures, referring and communicating with other health care professionals, Documenting  clinical information in the EMR, Independently interpreting results (not separately reported), Communicating results to the patient/family/caregiver, Counseling and educating the patient/family/caregiver and Care coordination (not separately reported).

## 2022-04-17 ENCOUNTER — Other Ambulatory Visit (HOSPITAL_COMMUNITY): Payer: Self-pay

## 2022-04-17 ENCOUNTER — Inpatient Hospital Stay (HOSPITAL_COMMUNITY): Payer: Medicare Other

## 2022-04-17 DIAGNOSIS — Z720 Tobacco use: Secondary | ICD-10-CM

## 2022-04-17 DIAGNOSIS — E876 Hypokalemia: Secondary | ICD-10-CM | POA: Diagnosis not present

## 2022-04-17 DIAGNOSIS — N3090 Cystitis, unspecified without hematuria: Secondary | ICD-10-CM

## 2022-04-17 DIAGNOSIS — N1330 Unspecified hydronephrosis: Secondary | ICD-10-CM | POA: Diagnosis not present

## 2022-04-17 DIAGNOSIS — D72825 Bandemia: Secondary | ICD-10-CM | POA: Diagnosis not present

## 2022-04-17 DIAGNOSIS — N179 Acute kidney failure, unspecified: Secondary | ICD-10-CM | POA: Diagnosis not present

## 2022-04-17 DIAGNOSIS — R1030 Lower abdominal pain, unspecified: Secondary | ICD-10-CM

## 2022-04-17 LAB — CBC WITH DIFFERENTIAL/PLATELET
Abs Immature Granulocytes: 0.04 10*3/uL (ref 0.00–0.07)
Basophils Absolute: 0.1 10*3/uL (ref 0.0–0.1)
Basophils Relative: 1 %
Eosinophils Absolute: 0.5 10*3/uL (ref 0.0–0.5)
Eosinophils Relative: 4 %
HCT: 32.4 % — ABNORMAL LOW (ref 36.0–46.0)
Hemoglobin: 10.9 g/dL — ABNORMAL LOW (ref 12.0–15.0)
Immature Granulocytes: 0 %
Lymphocytes Relative: 14 %
Lymphs Abs: 1.8 10*3/uL (ref 0.7–4.0)
MCH: 33.3 pg (ref 26.0–34.0)
MCHC: 33.6 g/dL (ref 30.0–36.0)
MCV: 99.1 fL (ref 80.0–100.0)
Monocytes Absolute: 1.1 10*3/uL — ABNORMAL HIGH (ref 0.1–1.0)
Monocytes Relative: 8 %
Neutro Abs: 9.5 10*3/uL — ABNORMAL HIGH (ref 1.7–7.7)
Neutrophils Relative %: 73 %
Platelets: 436 10*3/uL — ABNORMAL HIGH (ref 150–400)
RBC: 3.27 MIL/uL — ABNORMAL LOW (ref 3.87–5.11)
RDW: 13.8 % (ref 11.5–15.5)
WBC: 13 10*3/uL — ABNORMAL HIGH (ref 4.0–10.5)
nRBC: 0 % (ref 0.0–0.2)

## 2022-04-17 LAB — MAGNESIUM: Magnesium: 1.3 mg/dL — ABNORMAL LOW (ref 1.7–2.4)

## 2022-04-17 LAB — RENAL FUNCTION PANEL
Albumin: 2.9 g/dL — ABNORMAL LOW (ref 3.5–5.0)
Anion gap: 13 (ref 5–15)
BUN: <5 mg/dL — ABNORMAL LOW (ref 8–23)
CO2: 25 mmol/L (ref 22–32)
Calcium: 8.1 mg/dL — ABNORMAL LOW (ref 8.9–10.3)
Chloride: 99 mmol/L (ref 98–111)
Creatinine, Ser: 0.48 mg/dL (ref 0.44–1.00)
GFR, Estimated: >60 mL/min (ref >60)
Glucose, Bld: 84 mg/dL (ref 70–99)
Phosphorus: 2.4 mg/dL — ABNORMAL LOW (ref 2.5–4.6)
Potassium: 3 mmol/L — ABNORMAL LOW (ref 3.5–5.1)
Sodium: 137 mmol/L (ref 135–145)

## 2022-04-17 MED ORDER — POLYETHYLENE GLYCOL 3350 17 G PO PACK: 17.0000 g | PACK | Freq: Two times a day (BID) | ORAL | Status: DC | PRN

## 2022-04-17 MED ORDER — POLYETHYLENE GLYCOL 3350 17 GM/SCOOP PO POWD
17.0000 g | Freq: Two times a day (BID) | ORAL | 1 refills | Status: AC | PRN
  Filled 2022-04-17: qty 476, 14d supply, fill #0

## 2022-04-17 MED ORDER — POTASSIUM CHLORIDE CRYS ER 20 MEQ PO TBCR
40.0000 meq | EXTENDED_RELEASE_TABLET | ORAL | Status: AC
  Administered 2022-04-17 (×3): 40 meq via ORAL
  Filled 2022-04-17 (×3): qty 2

## 2022-04-17 MED ORDER — METOPROLOL TARTRATE 25 MG PO TABS
25.0000 mg | ORAL_TABLET | Freq: Two times a day (BID) | ORAL | 1 refills | Status: AC
  Filled 2022-04-17: qty 60, 30d supply, fill #0

## 2022-04-17 MED ORDER — THIAMINE HCL 100 MG PO TABS
100.0000 mg | ORAL_TABLET | Freq: Every day | ORAL | 1 refills | Status: AC
  Filled 2022-04-17: qty 30, 30d supply, fill #0

## 2022-04-17 MED ORDER — NICOTINE 21 MG/24HR TD PT24
21.0000 mg | MEDICATED_PATCH | Freq: Every day | TRANSDERMAL | 0 refills | Status: AC
  Filled 2022-04-17: qty 28, 28d supply, fill #0

## 2022-04-17 MED ORDER — SENNOSIDES-DOCUSATE SODIUM 8.6-50 MG PO TABS: 1.0000 | ORAL_TABLET | Freq: Two times a day (BID) | ORAL | Status: DC | PRN

## 2022-04-17 MED ORDER — MAGNESIUM SULFATE 4 GM/100ML IV SOLN
4.0000 g | Freq: Once | INTRAVENOUS | Status: AC
  Administered 2022-04-17: 4 g via INTRAVENOUS
  Filled 2022-04-17: qty 100

## 2022-04-17 MED ORDER — SENNOSIDES-DOCUSATE SODIUM 8.6-50 MG PO TABS: 1.0000 | ORAL_TABLET | Freq: Two times a day (BID) | ORAL | 0 refills | Status: AC | PRN

## 2022-04-17 MED ORDER — SODIUM CHLORIDE 0.9% FLUSH
5.0000 mL | Freq: Three times a day (TID) | INTRAVENOUS | 1 refills | Status: AC
  Filled 2022-04-17: qty 1800, 60d supply, fill #0

## 2022-04-17 MED ORDER — AMLODIPINE BESYLATE 10 MG PO TABS
10.0000 mg | ORAL_TABLET | Freq: Every day | ORAL | 1 refills | Status: AC
  Filled 2022-04-17: qty 30, 30d supply, fill #0

## 2022-04-17 MED ORDER — OXYCODONE HCL 5 MG PO TABS
5.0000 mg | ORAL_TABLET | Freq: Three times a day (TID) | ORAL | Status: DC | PRN
  Administered 2022-04-18 (×2): 5 mg via ORAL
  Filled 2022-04-17 (×2): qty 1

## 2022-04-17 MED ORDER — ADULT MULTIVITAMIN W/MINERALS CH: 1.0000 | ORAL_TABLET | Freq: Every day | ORAL | Status: DC

## 2022-04-17 MED ORDER — HYDROMORPHONE HCL 1 MG/ML IJ SOLN
0.5000 mg | INTRAMUSCULAR | Status: DC | PRN
  Administered 2022-04-17: 0.5 mg via INTRAVENOUS
  Filled 2022-04-17: qty 0.5

## 2022-04-17 MED ORDER — KETOROLAC TROMETHAMINE 15 MG/ML IJ SOLN
15.0000 mg | Freq: Once | INTRAMUSCULAR | Status: AC
  Administered 2022-04-17: 15 mg via INTRAVENOUS
  Filled 2022-04-17: qty 1

## 2022-04-17 MED ORDER — OXYCODONE HCL 5 MG PO TABS
5.0000 mg | ORAL_TABLET | Freq: Four times a day (QID) | ORAL | Status: DC | PRN
  Administered 2022-04-17: 5 mg via ORAL
  Filled 2022-04-17: qty 1

## 2022-04-17 NOTE — Progress Notes (Signed)
Pt having c/o ongoing severe abd pain 10/10 aching w/ intermittent spasms. Abd distended and tender to touch w/ hyperactive bowel sounds. Pt having flatulence w/ type 7 stool. Nephrostomy sites are WDL w/ good UOP. PRN Dilaudid given w/ no improvement in pain, attempted to ambulate pt to help w/ distension but unable to ambulate more than a few feet d/t pain. Notified Bridgett Larsson, MD of these findings. Toradol IV 1x order placed. Pt has reported good pain control w/ Toradol. Will continue to monitor.

## 2022-04-17 NOTE — Progress Notes (Signed)
Reviewed chart WBC 13 dow a bit Back pains re the same and milder from tubes Still lower abdominal pain and distended and bloated ID saw patient and signed off and stopped ATB Belly bit distended and soft *retroperitoneal inflammation can cause secondary bowel dysfunction/ileus De to protract diffuse belly pain ? Get patient seen by GI

## 2022-04-17 NOTE — Progress Notes (Signed)
Mobility Specialist Cancellation/Refusal Note:   Reason for Cancellation/Refusal: Pt declined mobility at the time due to leaking IV & being in pain.  Will check back as schedule permits.     [facility]
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
## 2022-04-17 NOTE — Progress Notes (Signed)
[facility]  Date of Admission:  04/06/2022           Reason for visit: Follow up on hydroureteral nephrosis and incontinence  Current antibiotics: No antibiotics   ASSESSMENT:    66 y.o. female admitted with:  Bilateral hydroureteronephrosis and incontinence: Unclear etiology for her presentation, however, urinalysis on admission was unremarkable and was not suggestive of an infection.  Her cultures are also negative x2.  The inflammation noted on CT scan seems less likely due to infection given the negative work-up as above.  Nonetheless, she did receive 10 days of antibiotic coverage which should adequately treat a pyogenic pyelonephritis.  She remains afebrile with improving leukocytosis following discontinuation of ceftriaxone yesterday. Acute kidney injury: Her creatinine has improved following nephrostomy tube placement. Leukocytosis: Improved.  RECOMMENDATIONS:    Continue to monitor off antibiotics Discussed with primary team Will sign off, please call if needed.   Principal Problem:   AKI (acute kidney injury) ([place]) Active Problems:   Cystitis   Hydroureteronephrosis   Tobacco abuse   Alcohol abuse   Hyponatremia   Hypokalemia    MEDICATIONS:    Scheduled Meds:  amLODipine  10 mg Oral Daily   calcium carbonate  1 tablet Oral BID WC   Chlorhexidine Gluconate Cloth  6 each Topical Daily   docusate sodium  100 mg Oral BID   folic acid  1 mg Oral Daily   metoprolol tartrate  25 mg Oral BID   multivitamin with minerals  1 tablet Oral Daily   nicotine  21 mg Transdermal Daily   pantoprazole  40 mg Oral Daily   polyethylene glycol  17 g Oral Daily   potassium chloride  40 mEq Oral Q3H   senna-docusate  1 tablet Oral BID   sodium bicarbonate  650 mg Oral BID   sodium chloride flush  5 mL Intracatheter Q8H   thiamine  100 mg Oral Daily   Or   thiamine  100 mg Intravenous Daily   Continuous Infusions:  magnesium sulfate  bolus IVPB     PRN Meds:.acetaminophen **OR** acetaminophen, hydrALAZINE, HYDROmorphone (DILAUDID) injection, hydrOXYzine, methocarbamol, ondansetron (ZOFRAN) IV, oxyCODONE  SUBJECTIVE:   24 hour events:  No acute events noted overnight Afebrile, Tmax 98.9 Improved leukocytosis.  Stable renal function.  No new cultures No new imaging   She has no fevers or chills.  She is tolerating p.o. intake.  She continues to report pain.  Review of Systems  All other systems reviewed and are negative.     OBJECTIVE:   Blood pressure (!) 152/77, pulse 81, temperature 98.3 F (36.8 C), temperature source Oral, resp. rate 20, height '5\' 6"'$  (1.676 m), weight 53.6 kg, SpO2 93 %. Body mass index is 19.07 kg/m.  Physical Exam Constitutional:      General: She is not in acute distress.    Appearance: Normal appearance.  HENT:     Head: Normocephalic and atraumatic.  Eyes:     Extraocular Movements: Extraocular movements intact.     Conjunctiva/sclera: Conjunctivae normal.  Pulmonary:     Effort: Pulmonary effort is normal. No respiratory distress.  Genitourinary:    Comments: Bilateral nephrostomy tubes in place Musculoskeletal:        General: Normal range of motion.     Cervical back: Normal range of motion and neck supple.  Skin:    General: Skin is warm and dry.  Neurological:     General: No focal  deficit present.     Mental Status: She is alert and oriented to person, place, and time.  Psychiatric:        Mood and Affect: Mood normal.        Behavior: Behavior normal.      Lab Results: Lab Results  Component Value Date   WBC 13.0 (H) 04/17/2022   HGB 10.9 (L) 04/17/2022   HCT 32.4 (L) 04/17/2022   MCV 99.1 04/17/2022   PLT 436 (H) 04/17/2022    Lab Results  Component Value Date   NA 137 04/17/2022   K 3.0 (L) 04/17/2022   CO2 25 04/17/2022   GLUCOSE 84 04/17/2022   BUN <5 (L) 04/17/2022   CREATININE 0.48 04/17/2022   CALCIUM 8.1 (L) 04/17/2022   GFRNONAA >60  04/17/2022    Lab Results  Component Value Date   ALT 10 04/14/2022   AST 16 04/14/2022   ALKPHOS 59 04/14/2022   BILITOT 0.9 04/14/2022    No results found for: "CRP"     Component Value Date/Time   ESRSEDRATE 51 (H) 04/15/2022 0528     I have reviewed the micro and lab results in Epic.  Imaging: IR NEPHROSTOMY PLACEMENT LEFT  Result Date: 04/16/2022 INDICATION: 66 year old female presents for left percutaneous nephrostomy EXAM: ULTRASOUND GUIDED VENIPUNCTURE IMAGE GUIDED LEFT-SIDED PERCUTANEOUS NEPHROSTOMY COMPARISON:  None MEDICATIONS: 2 g Ancef; The antibiotic was administered in an appropriate time frame prior to skin puncture. ANESTHESIA/SEDATION: Fentanyl 100 mcg IV; Versed 2.0 mg IV Moderate Sedation Time:  12 minutes The patient was continuously monitored during the procedure by the interventional radiology nurse under my direct supervision. CONTRAST:  64m OMNIPAQUE IOHEXOL 300 MG/ML SOLN - administered into the collecting system(s) FLUOROSCOPY TIME:  Fluoroscopy Time: 0 minutes 48 seconds (2 mGy). COMPLICATIONS: None PROCEDURE: Informed written consent was obtained from the patient after a thorough discussion of the procedural risks, benefits and alternatives. All questions were addressed. Maximal Sterile Barrier Technique was utilized including caps, mask, sterile gowns, sterile gloves, sterile drape, hand hygiene and skin antiseptic. A timeout was performed prior to the initiation of the procedure. Ultrasound survey of the right upper extremity was performed with images stored and sent to PACs. A micropuncture needle was used access the right brachial vein under ultrasound. With venous blood flow returned, an .018 micro wire was passed through the needle into the vein. The needle was removed, and a micropuncture sheath was placed over the wire. The inner dilator and wire were removed, and the catheter was secured in position after attaching to saline flush. We then proceeded with  percutaneous nephrostomy. Patient positioned prone position on the fluoroscopy table. Ultrasound survey of the left flank was performed with images stored and sent to PACs. The patient was then prepped and draped in the usual sterile fashion. 1% lidocaine was used to anesthetize the skin and subcutaneous tissues for local anesthesia. A Chiba needle was then used to access a posterior inferior calyx with ultrasound guidance. With spontaneous urine returned through the needle, passage of an 018 micro wire into the collecting system was performed under fluoroscopy. A small incision was made with an 11 blade scalpel, and the needle was removed from the wire. An Accustick system was then advanced over the wire into the collecting system under fluoroscopy. The metal stiffener and inner dilator were removed, and then a sample of fluid was aspirated through the 4 French outer sheath. Bentson wire was passed into the collecting system and the sheath removed. Ten  French dilation of the soft tissues was performed. Using modified Seldinger technique, a 10 French pigtail catheter drain was placed over the Bentson wire. Wire and inner stiffener removed, and the pigtail was formed in the collecting system. Small amount of contrast confirmed position of the catheter. Patient tolerated the procedure well and remained hemodynamically stable throughout. No complications were encountered and no significant blood loss encountered IMPRESSION: Status post ultrasound-guided right upper extremity venipuncture, image guided left percutaneous nephrostomy. Signed, Dulcy Fanny. Nadene Rubins, RPVI Vascular and Interventional Radiology Specialists [facility] Radiology Electronically Signed   By: Corrie Mckusick D.O.   On: 04/16/2022 08:54   IR RADIOLOGY PERIPHERAL GUIDED IV START  Result Date: 04/16/2022 INDICATION: 66 year old female presents for left percutaneous nephrostomy EXAM: ULTRASOUND GUIDED VENIPUNCTURE IMAGE GUIDED LEFT-SIDED  PERCUTANEOUS NEPHROSTOMY COMPARISON:  None MEDICATIONS: 2 g Ancef; The antibiotic was administered in an appropriate time frame prior to skin puncture. ANESTHESIA/SEDATION: Fentanyl 100 mcg IV; Versed 2.0 mg IV Moderate Sedation Time:  12 minutes The patient was continuously monitored during the procedure by the interventional radiology nurse under my direct supervision. CONTRAST:  95m OMNIPAQUE IOHEXOL 300 MG/ML SOLN - administered into the collecting system(s) FLUOROSCOPY TIME:  Fluoroscopy Time: 0 minutes 48 seconds (2 mGy). COMPLICATIONS: None PROCEDURE: Informed written consent was obtained from the patient after a thorough discussion of the procedural risks, benefits and alternatives. All questions were addressed. Maximal Sterile Barrier Technique was utilized including caps, mask, sterile gowns, sterile gloves, sterile drape, hand hygiene and skin antiseptic. A timeout was performed prior to the initiation of the procedure. Ultrasound survey of the right upper extremity was performed with images stored and sent to PACs. A micropuncture needle was used access the right brachial vein under ultrasound. With venous blood flow returned, an .018 micro wire was passed through the needle into the vein. The needle was removed, and a micropuncture sheath was placed over the wire. The inner dilator and wire were removed, and the catheter was secured in position after attaching to saline flush. We then proceeded with percutaneous nephrostomy. Patient positioned prone position on the fluoroscopy table. Ultrasound survey of the left flank was performed with images stored and sent to PACs. The patient was then prepped and draped in the usual sterile fashion. 1% lidocaine was used to anesthetize the skin and subcutaneous tissues for local anesthesia. A Chiba needle was then used to access a posterior inferior calyx with ultrasound guidance. With spontaneous urine returned through the needle, passage of an 018 micro wire  into the collecting system was performed under fluoroscopy. A small incision was made with an 11 blade scalpel, and the needle was removed from the wire. An Accustick system was then advanced over the wire into the collecting system under fluoroscopy. The metal stiffener and inner dilator were removed, and then a sample of fluid was aspirated through the 4 French outer sheath. Bentson wire was passed into the collecting system and the sheath removed. Ten French dilation of the soft tissues was performed. Using modified Seldinger technique, a 10 French pigtail catheter drain was placed over the Bentson wire. Wire and inner stiffener removed, and the pigtail was formed in the collecting system. Small amount of contrast confirmed position of the catheter. Patient tolerated the procedure well and remained hemodynamically stable throughout. No complications were encountered and no significant blood loss encountered IMPRESSION: Status post ultrasound-guided right upper extremity venipuncture, image guided left percutaneous nephrostomy. Signed, JDulcy Fanny WDellia Nims ABVM, RPVI Vascular and Interventional Radiology  Specialists [facility] Radiology Electronically Signed   By: Corrie Mckusick D.O.   On: 04/16/2022 08:54   IR US Guide Vasc Access Right  Result Date: 04/16/2022 INDICATION: 66 year old female presents for left percutaneous nephrostomy EXAM: ULTRASOUND GUIDED VENIPUNCTURE IMAGE GUIDED LEFT-SIDED PERCUTANEOUS NEPHROSTOMY COMPARISON:  None MEDICATIONS: 2 g Ancef; The antibiotic was administered in an appropriate time frame prior to skin puncture. ANESTHESIA/SEDATION: Fentanyl 100 mcg IV; Versed 2.0 mg IV Moderate Sedation Time:  12 minutes The patient was continuously monitored during the procedure by the interventional radiology nurse under my direct supervision. CONTRAST:  78m OMNIPAQUE IOHEXOL 300 MG/ML SOLN - administered into the collecting system(s) FLUOROSCOPY TIME:  Fluoroscopy Time: 0 minutes 48 seconds  (2 mGy). COMPLICATIONS: None PROCEDURE: Informed written consent was obtained from the patient after a thorough discussion of the procedural risks, benefits and alternatives. All questions were addressed. Maximal Sterile Barrier Technique was utilized including caps, mask, sterile gowns, sterile gloves, sterile drape, hand hygiene and skin antiseptic. A timeout was performed prior to the initiation of the procedure. Ultrasound survey of the right upper extremity was performed with images stored and sent to PACs. A micropuncture needle was used access the right brachial vein under ultrasound. With venous blood flow returned, an .018 micro wire was passed through the needle into the vein. The needle was removed, and a micropuncture sheath was placed over the wire. The inner dilator and wire were removed, and the catheter was secured in position after attaching to saline flush. We then proceeded with percutaneous nephrostomy. Patient positioned prone position on the fluoroscopy table. Ultrasound survey of the left flank was performed with images stored and sent to PACs. The patient was then prepped and draped in the usual sterile fashion. 1% lidocaine was used to anesthetize the skin and subcutaneous tissues for local anesthesia. A Chiba needle was then used to access a posterior inferior calyx with ultrasound guidance. With spontaneous urine returned through the needle, passage of an 018 micro wire into the collecting system was performed under fluoroscopy. A small incision was made with an 11 blade scalpel, and the needle was removed from the wire. An Accustick system was then advanced over the wire into the collecting system under fluoroscopy. The metal stiffener and inner dilator were removed, and then a sample of fluid was aspirated through the 4 French outer sheath. Bentson wire was passed into the collecting system and the sheath removed. Ten French dilation of the soft tissues was performed. Using modified  Seldinger technique, a 10 French pigtail catheter drain was placed over the Bentson wire. Wire and inner stiffener removed, and the pigtail was formed in the collecting system. Small amount of contrast confirmed position of the catheter. Patient tolerated the procedure well and remained hemodynamically stable throughout. No complications were encountered and no significant blood loss encountered IMPRESSION: Status post ultrasound-guided right upper extremity venipuncture, image guided left percutaneous nephrostomy. Signed, JDulcy Fanny WNadene Rubins RPVI Vascular and Interventional Radiology Specialists [facility] Electronically Signed   By: JCorrie MckusickD.O.   On: 04/16/2022 08:54     Imaging independently reviewed in Epic.    ARaynelle Highlandfor Infectious Disease CGre[facility] HospitalGroup [phone] 04/17/2022, 10:52 AM

## 2022-04-17 NOTE — Progress Notes (Signed)
PROGRESS NOTE  Betty [name] PYK:[id number] DOB: 11-07-1955   PCP: Pcp, No  Patient is from: Home  DOA: 04/06/2022 LOS: 17  Chief complaints Chief Complaint  Patient presents with   Flank Pain     Brief Narrative / Interim history: 66 year old F with PMH of EtOH use, tobacco use, HTN and GERD presenting with right flank pain and admitted with AKI.  CT abdomen and pelvis showed mild bilateral hydroureteronephrosis, circumferential bladder wall thickening suggestive for cystitis and marked low-density material measuring 6 mm in thickness surrounding the proximal and right mid ureteral.  Urology and IR consulted.  Patient had right PCN on 7/28 with improvement in renal function.  Then she developed left flank pain.  Repeat CT abdomen and pelvis showed unchanged left-sided hydroureteronephrosis with new left perinephric inflammatory change.  Patient had left PCN on 7/31.  ID consulted on 8/1 and recommended observing off antibiotics since she completed 10 days of ceftriaxone from 7/22-8/1.  Patient continues to endorse severe left flank pain and pain across lower abdomen.   Subjective: Seen and examined earlier this morning.  Continues to endorse left flank pain and pain across lower abdomen.  She rates her pain 10/10.  She says pain is constant.  Improved with pain medication.  Denies nausea or vomiting.  Having loose stools after constipation.  Denies blood in stool.  She has Foley catheter in place  Objective: Vitals:   04/16/22 0941 04/16/22 1356 04/16/22 2317 04/17/22 0605  BP: (!) 163/77 129/70 129/89 (!) 152/77  Pulse: 92 81 73 81  Resp:  _0 Temp:  98.9 F (37.2 C) 98.1 F (36.7 C) 98.3 F (36.8 C)  TempSrc:  Oral Oral Oral  SpO2:  90% (!) 89% 93%  Weight:      Height:        Examination:  GENERAL: No apparent distress.  Nontoxic. HEENT: MMM.  Vision and hearing grossly intact.  NECK: Supple.  No apparent JVD.  RESP:  No IWOB.  Fair aeration bilaterally. CVS:   RRR. Heart sounds normal.  ABD/GI/GU: BS+. Abd soft.  Diffuse tenderness across lower abdomen.  No CVA tenderness.  Clear urine in right PCN.  Slight blood streak in left PCN. MSK/EXT:  Moves extremities. No apparent deformity. No edema.  SKIN: no apparent skin lesion or wound NEURO: Awake, alert and oriented appropriately.  No apparent focal neuro deficit. PSYCH: Calm. Normal affect.   Procedures:  7/28-right PCN 7/31-left PCN  Microbiology summarized: 7/22-urine culture with insignificant growth 7/28-urine culture NGTD  Assessment and plan: Principal Problem:   AKI (acute kidney injury) ([place]) Active Problems:   Cystitis   Hydroureteronephrosis   Tobacco abuse   Alcohol abuse   Hyponatremia   Hypokalemia  AKI in the setting of bilateral hydroureteronephrosis of unclear etiology but concern about retroperitoneal fibrosis and pyelonephritis: Still with left flank pain -AKI resolved. -Imaging showed mild to moderate bilateral hydroureteronephrosis -S/p right PCN on 7/28 and left PCN on 7/31 -IgG4-normal.  ESR 50 -IV CTX 7/22-8/1 -ID stopped antibiotics -Urology following.  Low abdominal pain: Reports pain across lower abdomen.  She had constipation that has changed to diarrhea after bowel regimen.  She rates her pain 10/10.  She is tender to palpation across lower abdomen but no rebound tenderness.  -Check KUB -C. difficile testing? -May may have to start weaning of opiates.  Constipation: Started on laxative now with diarrhea. -Check two-view KUB  Hypokalemia/hypomagnesemia/hyponatremia: K3.0.  Mg 1.3.  Hyponatremia resolved. -P.o. KCl  40x3 -IV magnesium sulfate 4 g x 1  Leukocytosis/bandemia: Improving.   Alcohol use disorder: Should be outside the window for withdrawal -Discontinue CIWA -Continue vitamins   Tobacco abuse: Continue with nicotine patch  Essential hypertension: BP within acceptable range for most part. -Continue amlodipine and low-dose  metoprolol   Mild hypocalcemia: Corrects to normal for hypoalbuminemia.  Anion gap metabolic acidosis: Resolved.  Body mass index is 19.07 kg/m. -Consult dietitian         DVT prophylaxis:  Place and maintain sequential compression device Start: 04/15/22 1145  Code Status: DNR/DNI Family Communication: None at bedside Level of care: Telemetry Status is: Inpatient Remains inpatient appropriate because: Due to severe abdominal pain and electrolyte derangement   Final disposition: Home Consultants:  Urology Infectious disease  Sch Meds:  Scheduled Meds:  amLODipine  10 mg Oral Daily   calcium carbonate  1 tablet Oral BID WC   Chlorhexidine Gluconate Cloth  6 each Topical Daily   docusate sodium  100 mg Oral BID   folic acid  1 mg Oral Daily   metoprolol tartrate  25 mg Oral BID   multivitamin with minerals  1 tablet Oral Daily   nicotine  21 mg Transdermal Daily   pantoprazole  40 mg Oral Daily   potassium chloride  40 mEq Oral Q3H   sodium chloride flush  5 mL Intracatheter Q8H   thiamine  100 mg Oral Daily   Or   thiamine  100 mg Intravenous Daily   Continuous Infusions:  magnesium sulfate bolus IVPB 4 g (04/17/22 1125)   PRN Meds:.acetaminophen **OR** acetaminophen, hydrALAZINE, HYDROmorphone (DILAUDID) injection, hydrOXYzine, methocarbamol, ondansetron (ZOFRAN) IV, oxyCODONE, polyethylene glycol, senna-docusate  Antimicrobials: Anti-infectives (From admission, onward)    Start     Dose/Rate Route Frequency Ordered Stop   04/10/22 1630  cefTRIAXone (ROCEPHIN) 2 g in sodium chloride 0.9 % 100 mL IVPB  Status:  Discontinued        2 g 200 mL/hr over 30 Minutes Intravenous Every 24 hours 04/10/22 1547 04/16/22 1424   04/07/22 0800  cefTRIAXone (ROCEPHIN) 1 g in sodium chloride 0.9 % 100 mL IVPB  Status:  Discontinued        1 g 200 mL/hr over 30 Minutes Intravenous Every 24 hours 04/06/22 1422 04/09/22 1042   04/06/22 0845  cefTRIAXone (ROCEPHIN) 1 g in sodium  chloride 0.9 % 100 mL IVPB        1 g 200 mL/hr over 30 Minutes Intravenous  Once 04/06/22 0832 04/06/22 1003        I have personally reviewed the following labs and images: CBC: Recent Labs  Lab 04/13/22 0618 04/14/22 0807 04/15/22 0528 04/16/22 0453 04/17/22 0840  WBC 15.3* 14.4* 14.8* 17.1* 13.0*  NEUTROABS  --   --   --   --  9.5*  HGB 11.5* 11.6* 11.5* 11.0* 10.9*  HCT 33.2* 34.4* 35.0* 33.3* 32.4*  MCV 96.2 97.5 99.4 100.0 99.1  PLT 499* 440* 451* 420* 436*   BMP &GFR Recent Labs  Lab 04/13/22 0618 04/13/22 1420 04/14/22 0807 04/14/22 1724 04/15/22 0528 04/16/22 0453 04/17/22 0840  NA 140 139 136  --  135 137 137  K 2.1* 3.5 2.6* 3.9 3.8 3.2* 3.0*  CL 97* 102 94*  --  98 99 99  CO2 _0 --  _1 GLUCOSE 96 96 90  --  81 83 84  BUN 8 6* 5*  --  5* 5* <5*  CREATININE 0.98 0.87 0.66  --  0.73 0.57 0.48  CALCIUM 8.2* 8.2* 7.8*  --  8.0* 7.9* 8.1*  MG 1.2*  --  1.3*  --  1.5* 1.8 1.3*  PHOS  --   --   --   --   --   --  2.4*   Estimated Creatinine Clearance: 59.3 mL/min (by C-G formula based on SCr of 0.48 mg/dL). Liver & Pancreas: Recent Labs  Lab 04/14/22 1436 04/17/22 0840  AST 16  --   ALT 10  --   ALKPHOS 59  --   BILITOT 0.9  --   PROT 6.6  --   ALBUMIN 3.2* 2.9*   Recent Labs  Lab 04/15/22 0528  LIPASE 33   No results for input(s): "AMMONIA" in the last 168 hours. Diabetic: No results for input(s): "HGBA1C" in the last 72 hours. No results for input(s): "GLUCAP" in the last 168 hours. Cardiac Enzymes: No results for input(s): "CKTOTAL", "CKMB", "CKMBINDEX", "TROPONINI" in the last 168 hours. No results for input(s): "PROBNP" in the last 8760 hours. Coagulation Profile: Recent Labs  Lab 04/12/22 0605  INR 1.1   Thyroid Function Tests: No results for input(s): "TSH", "T4TOTAL", "FREET4", "T3FREE", "THYROIDAB" in the last 72 hours. Lipid Profile: No results for input(s): "CHOL", "HDL", "LDLCALC", "TRIG", "CHOLHDL",  "LDLDIRECT" in the last 72 hours. Anemia Panel: No results for input(s): "VITAMINB12", "FOLATE", "FERRITIN", "TIBC", "IRON", "RETICCTPCT" in the last 72 hours. Urine analysis:    Component Value Date/Time   COLORURINE STRAW (A) 04/06/2022 0555   APPEARANCEUR CLEAR 04/06/2022 0555   LABSPEC 1.009 04/06/2022 0555   PHURINE 5.0 04/06/2022 0555   GLUCOSEU NEGATIVE 04/06/2022 0555   HGBUR SMALL (A) 04/06/2022 0555   BILIRUBINUR NEGATIVE 04/06/2022 0555   KETONESUR NEGATIVE 04/06/2022 0555   PROTEINUR NEGATIVE 04/06/2022 0555   NITRITE NEGATIVE 04/06/2022 0555   LEUKOCYTESUR NEGATIVE 04/06/2022 0555   Sepsis Labs: Invalid input(s): "PROCALCITONIN", "LACTICIDVEN"  Microbiology: Recent Results (from the past 240 hour(s))  Urine Culture     Status: None   Collection Time: 04/12/22  3:01 PM   Specimen: Urine, Random  Result Value Ref Range Status   Specimen Description   Final    URINE, RANDOM Performed at [facility] [address], [place], [place] [address]    Special Requests   Final    RIGHT KIDNEY Performed at [facility], [place] [address] [place], [place] [address]    Culture   Final    NO GROWTH Performed at [facility], [place] [address], [place], [place] [address]    Report Status 04/13/2022 FINAL  Final    Radiology Studies: No results found.    Marieme Mcmackin T. [place]  If 7PM-7AM, please contact night-coverage [email or web address] 04/17/2022, 1:14 PM

## 2022-04-18 ENCOUNTER — Other Ambulatory Visit (HOSPITAL_COMMUNITY): Payer: Self-pay

## 2022-04-18 ENCOUNTER — Other Ambulatory Visit: Payer: Self-pay | Admitting: Student

## 2022-04-18 DIAGNOSIS — K567 Ileus, unspecified: Secondary | ICD-10-CM

## 2022-04-18 DIAGNOSIS — N1330 Unspecified hydronephrosis: Secondary | ICD-10-CM | POA: Diagnosis not present

## 2022-04-18 DIAGNOSIS — E876 Hypokalemia: Secondary | ICD-10-CM | POA: Diagnosis not present

## 2022-04-18 DIAGNOSIS — F419 Anxiety disorder, unspecified: Secondary | ICD-10-CM

## 2022-04-18 DIAGNOSIS — N179 Acute kidney failure, unspecified: Secondary | ICD-10-CM | POA: Diagnosis not present

## 2022-04-18 DIAGNOSIS — N3090 Cystitis, unspecified without hematuria: Secondary | ICD-10-CM | POA: Diagnosis not present

## 2022-04-18 LAB — CBC
HCT: 35.4 % — ABNORMAL LOW (ref 36.0–46.0)
Hemoglobin: 11.5 g/dL — ABNORMAL LOW (ref 12.0–15.0)
MCH: 32.5 pg (ref 26.0–34.0)
MCHC: 32.5 g/dL (ref 30.0–36.0)
MCV: 100 fL (ref 80.0–100.0)
Platelets: 571 10*3/uL — ABNORMAL HIGH (ref 150–400)
RBC: 3.54 MIL/uL — ABNORMAL LOW (ref 3.87–5.11)
RDW: 13.8 % (ref 11.5–15.5)
WBC: 14.5 10*3/uL — ABNORMAL HIGH (ref 4.0–10.5)
nRBC: 0 % (ref 0.0–0.2)

## 2022-04-18 LAB — RENAL FUNCTION PANEL
Albumin: 3.3 g/dL — ABNORMAL LOW (ref 3.5–5.0)
Anion gap: 10 (ref 5–15)
BUN: <5 mg/dL — ABNORMAL LOW (ref 8–23)
CO2: 27 mmol/L (ref 22–32)
Calcium: 8.6 mg/dL — ABNORMAL LOW (ref 8.9–10.3)
Chloride: 101 mmol/L (ref 98–111)
Creatinine, Ser: 0.57 mg/dL (ref 0.44–1.00)
GFR, Estimated: >60 mL/min (ref >60)
Glucose, Bld: 107 mg/dL — ABNORMAL HIGH (ref 70–99)
Phosphorus: 2.5 mg/dL (ref 2.5–4.6)
Potassium: 3.6 mmol/L (ref 3.5–5.1)
Sodium: 138 mmol/L (ref 135–145)

## 2022-04-18 LAB — TSH: TSH: 3.514 u[IU]/mL (ref 0.350–4.500)

## 2022-04-18 LAB — MAGNESIUM: Magnesium: 1.8 mg/dL (ref 1.7–2.4)

## 2022-04-18 LAB — C-REACTIVE PROTEIN: CRP: 4.1 mg/dL — ABNORMAL HIGH (ref <1.0)

## 2022-04-18 MED ORDER — POTASSIUM CHLORIDE CRYS ER 20 MEQ PO TBCR
40.0000 meq | EXTENDED_RELEASE_TABLET | Freq: Once | ORAL | Status: AC
  Administered 2022-04-18: 40 meq via ORAL
  Filled 2022-04-18: qty 2

## 2022-04-18 MED ORDER — OXYCODONE HCL 5 MG PO TABS
5.0000 mg | ORAL_TABLET | Freq: Three times a day (TID) | ORAL | Status: DC | PRN
  Administered 2022-04-18 – 2022-04-19 (×2): 5 mg via ORAL
  Filled 2022-04-18 (×2): qty 1

## 2022-04-18 MED ORDER — HYDROXYZINE HCL 25 MG PO TABS
25.0000 mg | ORAL_TABLET | Freq: Three times a day (TID) | ORAL | Status: DC | PRN
  Administered 2022-04-18 – 2022-04-19 (×3): 25 mg via ORAL
  Filled 2022-04-18 (×3): qty 1

## 2022-04-18 MED ORDER — POTASSIUM CHLORIDE 2 MEQ/ML IV SOLN
INTRAVENOUS | Status: DC
  Filled 2022-04-18 (×4): qty 1000

## 2022-04-18 NOTE — Progress Notes (Signed)
IV consult for new IV. The current one is leaking and is the 8th PIV in 12 days. Pt. States physician placed it and "it took longer to do than the procedure itself". Most of her PIVs have barely lasted 24 hrs even though she is mainly on fluids. I recommend a PICC if this pt. Needs further IV therapy. Spoke with provider and RN. Provider states the situation will be reevaluated tomorrow.

## 2022-04-18 NOTE — Plan of Care (Signed)
Problem: Education: Goal: Knowledge of General Education information will improve Description: Including pain rating scale, medication(s)/side effects and non-pharmacologic comfort measures Outcome: Progressing   Problem: Health Behavior/Discharge Planning: Goal: Ability to manage health-related needs will improve Outcome: Progressing   Problem: Clinical Measurements: Goal: Ability to maintain clinical measurements within normal limits will improve Outcome: Progressing Goal: Will remain free from infection Outcome: Progressing Goal: Diagnostic test results will improve Outcome: Progressing Goal: Cardiovascular complication will be avoided Outcome: Progressing   Problem: Activity: Goal: Risk for activity intolerance will decrease Outcome: Progressing   Problem: Coping: Goal: Level of anxiety will decrease Outcome: Progressing   Problem: Elimination: Goal: Will not experience complications related to bowel motility Outcome: Progressing   Problem: Pain Managment: Goal: General experience of comfort will improve Outcome: Progressing   Problem: Skin Integrity: Goal: Risk for impaired skin integrity will decrease Outcome: Progressing

## 2022-04-18 NOTE — Progress Notes (Signed)
Initial Nutrition Assessment  INTERVENTION:   -Declines nutritional supplements  -Encouraged to continue daily MVI  NUTRITION DIAGNOSIS:   Increased nutrient needs related to acute illness, chronic illness as evidenced by estimated needs.  GOAL:   Patient will meet greater than or equal to 90% of their needs  MONITOR:   PO intake, Labs, Weight trends, I & O's  REASON FOR ASSESSMENT:   Consult Assessment of nutrition requirement/status  ASSESSMENT:   66 year old F with PMH of EtOH use, tobacco use, HTN and GERD presenting with right flank pain and admitted with AKI.  CT abdomen and pelvis showed mild bilateral hydroureteronephrosis, circumferential bladder wall thickening suggestive for cystitis and marked low-density material measuring 6 mm in thickness surrounding the proximal and right mid ureteral.  7/28: s/p right PCN 7/31:s/p left PCN  Patient in room, walking around. Pt seemed a bit anxious and defensive of RD's questions. Pt states normally she eats 2 meals a day with a snack. Pt doesn't take vitamins at home. States she has been sick for 3 months, hasn't been eating much at all.  Pt does not want protein supplements.   Per weight records, no weight loss noted. Pt reports UBW is 125 lbs. Current weight: 118 lbs.  Medications: OSCAL, Folic acid, Multivitamin with minerals daily, KLOR-CON, Thiamine, Lactated ringers  Labs reviewed.  NUTRITION - FOCUSED PHYSICAL EXAM:  Pt became defensive upon exam and RD interview.   Flowsheet Row Most Recent Value  Orbital Region Unable to assess  Upper Arm Region Moderate depletion  Thoracic and Lumbar Region Unable to assess  Buccal Region Mild depletion  Temple Region Moderate depletion  Clavicle Bone Region Unable to assess  Clavicle and Acromion Bone Region Unable to assess  Scapular Bone Region Unable to assess  Dorsal Hand Unable to assess  Patellar Region Unable to assess  Anterior Thigh Region Unable to assess   Posterior Calf Region Unable to assess  Edema (RD Assessment) None       Diet Order:   Diet Order             Diet - low sodium heart healthy           Diet regular Room service appropriate? Yes; Fluid consistency: Thin  Diet effective now                   EDUCATION NEEDS:   Education needs have been addressed  Skin:  Skin Assessment: Reviewed RN Assessment  Last BM:  8/3 -type 7  Height:   Ht Readings from Last 1 Encounters:  04/06/22 '5\' 6"'$  (1.676 m)    Weight:   Wt Readings from Last 1 Encounters:  04/06/22 53.6 kg    BMI:  Body mass index is 19.07 kg/m.  Estimated Nutritional Needs:   Kcal:  1650-1850  Protein:  80-90g  Fluid:  1.8L/day   Clayton Bibles, MS, RD, LDN Inpatient Clinical Dietitian Contact information available via Amion

## 2022-04-18 NOTE — Progress Notes (Signed)
Ilius ? [place] to pain med of retroperitoneal inflammation Spoke with patient Will follow

## 2022-04-18 NOTE — Plan of Care (Signed)

## 2022-04-18 NOTE — Progress Notes (Signed)
PROGRESS NOTE  Betty [name]:[id number] DOB: [birth year]   PCP: Pcp, No  Patient is from: Home  DOA: 04/06/2022 LOS: 35  Chief complaints Chief Complaint  Patient presents with   Flank Pain     Brief Narrative / Interim history: 66 year old F with PMH of EtOH use, tobacco use, HTN and GERD presenting with right flank pain and admitted with AKI.  CT abdomen and pelvis showed mild bilateral hydroureteronephrosis, circumferential bladder wall thickening suggestive for cystitis and marked low-density material measuring 6 mm in thickness surrounding the proximal and right mid ureteral.  Urology and IR consulted.  Patient had right PCN on 7/28 with improvement in renal function.  Then she developed left flank pain.  Repeat CT abdomen and pelvis showed unchanged left-sided hydroureteronephrosis with new left perinephric inflammatory change.  Patient had left PCN on 7/31.  ID consulted on 8/1 and recommended observing off antibiotics since she completed 10 days of ceftriaxone from 7/22-8/1.  Patient continues to endorse severe left flank pain and pain across lower abdomen.  KUB suggests ileus.  Weaning of opiate and mobilizing.  Urology following.  Subjective: Seen and examined earlier this morning.  No major events overnight of this morning.  She reports improvement in her left flank pain but continues to endorse pain across lower abdomen.  She also reports frequent bowel movement with loose and watery stool.  No nausea or vomiting.  We have discussed the importance of cutting down on pain medication.  She agreed to discontinue Dilaudid and Robaxin.   Objective: Vitals:   04/17/22 0605 04/17/22 1427 04/17/22 2048 04/18/22 0630  BP: (!) 152/77 138/74 (!) 142/78 129/81  Pulse: 81 83 82 75  Resp: 20 18 18 19   Temp: 98.3 F (36.8 C) (!) 97.5 F (36.4 C) 98.6 F (37 C) 98.6 F (37 C)  TempSrc: Oral Oral Oral Oral  SpO2: 93% 92% 92% 93%  Weight:      Height:         Examination:  GENERAL: No apparent distress.  Nontoxic. HEENT: MMM.  Vision and hearing grossly intact.  NECK: Supple.  No apparent JVD.  RESP:  No IWOB.  Fair aeration bilaterally. CVS:  RRR. Heart sounds normal.  ABD/GI/GU: BS+. Abd soft.  Diffuse tenderness across lower abdomen. MSK/EXT:  Moves extremities. No apparent deformity. No edema.  SKIN: no apparent skin lesion or wound NEURO: Awake and alert. Oriented appropriately.  No apparent focal neuro deficit. PSYCH: Appears very anxious.  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 -S/p right PCN on 7/28 and left PCN on 7/31 -IgG4-normal, and ESR 50.  Added additional serologies for retroperitoneal fibrosis -Completed antibiotic course with IV ceftriaxone from 7/22-8/1 -ID stopped antibiotics -Urology following.  Low abdominal pain/ileus: Continues to endorse significant pain.  KUB on 8/2 suggests ileus.  She is having diarrhea as well. -Start bowel regimen -Stopped IV Dilaudid and Robaxin after discussion with patient -Decreased oxycodone to 5 mg every 8 hours as needed moderate and severe pain. -Encouraged ambulation and staying out of the bed  Constipation/diarrhea: Initially had constipation and she was started bowel regimen now  with diarrhea.  CBC suggest hemoconcentration. -Start bowel regimen -Start gentle IV fluid  Hypokalemia/hypomagnesemia/hyponatremia: Improved. -P.o. KCl 40x1  Leukocytosis/bandemia: Improving.   Alcohol use disorder: Should be outside the window for  withdrawal -Continue vitamins   Tobacco abuse: Continue with nicotine patch  Essential hypertension: Normotensive. -Continue amlodipine and low-dose metoprolol   Mild hypocalcemia: Corrects to normal for hypoalbuminemia.  Anion gap metabolic acidosis: Resolved.  Anxiety: Appears very anxious. -Increased Atarax to 25 mg every 8 hours as needed  Body mass index is 19.07 kg/m. -Consult dietitian         DVT prophylaxis:  Place and maintain sequential compression device Start: 04/15/22 1145  Code Status: DNR/DNI Family Communication: Updated patient's brother over the phone. Level of care: Med-Surg Status is: Inpatient Remains inpatient appropriate because: Due to severe abdominal pain   Final disposition: Home Consultants:  Urology Infectious disease  Sch Meds:  Scheduled Meds:  amLODipine  10 mg Oral Daily   calcium carbonate  1 tablet Oral BID WC   Chlorhexidine Gluconate Cloth  6 each Topical Daily   folic acid  1 mg Oral Daily   metoprolol tartrate  25 mg Oral BID   multivitamin with minerals  1 tablet Oral Daily   nicotine  21 mg Transdermal Daily   pantoprazole  40 mg Oral Daily   sodium chloride flush  5 mL Intracatheter Q8H   thiamine  100 mg Oral Daily   Or   thiamine  100 mg Intravenous Daily   Continuous Infusions:   PRN Meds:.acetaminophen **OR** acetaminophen, hydrALAZINE, hydrOXYzine, ondansetron (ZOFRAN) IV, oxyCODONE, polyethylene glycol, senna-docusate  Antimicrobials: Anti-infectives (From admission, onward)    Start     Dose/Rate Route Frequency Ordered Stop   04/10/22 1630  cefTRIAXone (ROCEPHIN) 2 g in sodium chloride 0.9 % 100 mL IVPB  Status:  Discontinued        2 g 200 mL/hr over 30 Minutes Intravenous Every 24 hours 04/10/22 1547 04/16/22 1424   04/07/22 0800  cefTRIAXone (ROCEPHIN) 1 g in sodium chloride 0.9 % 100 mL IVPB  Status:  Discontinued        1 g 200 mL/hr over 30 Minutes Intravenous Every 24 hours 04/06/22 1422  04/09/22 1042   04/06/22 0845  cefTRIAXone (ROCEPHIN) 1 g in sodium chloride 0.9 % 100 mL IVPB        1 g 200 mL/hr over 30 Minutes Intravenous  Once 04/06/22 0832 04/06/22 1003        I have personally reviewed the following labs and images: CBC: Recent Labs  Lab 04/14/22 0807 04/15/22 0528 04/16/22 0453 04/17/22 0840 04/18/22 0520  WBC 14.4* 14.8* 17.1* 13.0* 14.5*  NEUTROABS  --   --   --  9.5*  --   HGB 11.6* 11.5* 11.0* 10.9* 11.5*  HCT 34.4* 35.0* 33.3* 32.4* 35.4*  MCV 97.5 99.4 100.0 99.1 100.0  PLT 440* 451* 420* 436* 571*   BMP &GFR Recent Labs  Lab 04/14/22 0807 04/14/22 1724 04/15/22 0528 04/16/22 0453 04/17/22 0840 04/18/22 0520  NA 136  --  135 137 137 138  K 2.6* 3.9 3.8 3.2* 3.0* 3.6  CL 94*  --  98 99 99 101  CO2 27  --  24 24 25 27   GLUCOSE 90  --  81 83 84 107*  BUN 5*  --  5* 5* <5* <5*  CREATININE 0.66  --  0.73 0.57 0.48 0.57  CALCIUM 7.8*  --  8.0* 7.9* 8.1* 8.6*  MG 1.3*  --  1.5* 1.8 1.3* 1.8  PHOS  --   --   --   --  2.4* 2.5   Estimated Creatinine Clearance: 59.3 mL/min (by C-G formula based on SCr of 0.57 mg/dL). Liver & Pancreas: Recent Labs  Lab 04/14/22 1436 04/17/22 0840 04/18/22 0520  AST 16  --   --   ALT 10  --   --   ALKPHOS 59  --   --   BILITOT 0.9  --   --   PROT 6.6  --   --   ALBUMIN 3.2* 2.9* 3.3*   Recent Labs  Lab 04/15/22 0528  LIPASE 33   No results for input(s): "AMMONIA" in the last 168 hours. Diabetic: No results for input(s): "HGBA1C" in the last 72 hours. No results for input(s): "GLUCAP" in the last 168 hours. Cardiac Enzymes: No results for input(s): "CKTOTAL", "CKMB", "CKMBINDEX", "TROPONINI" in the last 168 hours. No results for input(s): "PROBNP" in the last 8760 hours. Coagulation Profile: Recent Labs  Lab 04/12/22 0605  INR 1.1   Thyroid Function Tests: No results for input(s): "TSH", "T4TOTAL", "FREET4", "T3FREE", "THYROIDAB" in the last 72 hours. Lipid Profile: No results for  input(s): "CHOL", "HDL", "LDLCALC", "TRIG", "CHOLHDL", "LDLDIRECT" in the last 72 hours. Anemia Panel: No results for input(s): "VITAMINB12", "FOLATE", "FERRITIN", "TIBC", "IRON", "RETICCTPCT" in the last 72 hours. Urine analysis:    Component Value Date/Time   COLORURINE STRAW (A) 04/06/2022 0555   APPEARANCEUR CLEAR 04/06/2022 0555   LABSPEC 1.009 04/06/2022 0555   PHURINE 5.0 04/06/2022 0555   GLUCOSEU NEGATIVE 04/06/2022 0555   HGBUR SMALL (A) 04/06/2022 0555   BILIRUBINUR NEGATIVE 04/06/2022 0555   KETONESUR NEGATIVE 04/06/2022 0555   PROTEINUR NEGATIVE 04/06/2022 0555   NITRITE NEGATIVE 04/06/2022 0555   LEUKOCYTESUR NEGATIVE 04/06/2022 0555   Sepsis Labs: Invalid input(s): "PROCALCITONIN", "LACTICIDVEN"  Microbiology: Recent Results (from the past 240 hour(s))  Urine Culture     Status: None   Collection Time: 04/12/22  3:01 PM   Specimen: Urine, Random  Result Value Ref Range Status   Specimen Description   Final    URINE, RANDOM Performed at [facility] [address] [place], [place] [address]    Special Requests   Final    RIGHT KIDNEY Performed at [facility], [place] [address], [place], [place] [address]    Culture   Final    NO GROWTH Performed at [facility], [place] [address] [place], [place] [address]    Report Status 04/13/2022 FINAL  Final    Radiology Studies: DG Abd 2 Views  Result Date: 04/17/2022 CLINICAL DATA:  Abdominal pain EXAM: ABDOMEN - 2 VIEW COMPARISON:  04/13/2022 FINDINGS: Bilateral nephrostomy catheters in place. Gaseous distention of the colon, to the level of the splenic flexure. No organomegaly or free air. Scattered vascular calcifications. Visualized lung bases clear. IMPRESSION: Gaseous distention of the colon to the splenic flexure. This likely reflects mild ileus. Degree of distention of the transverse colon increasing since prior study. Electronically Signed   By: Rolm Baptise M.D.   On:  04/17/2022 15:44      Harla Mensch T. [place]  If 7PM-7AM, please contact night-coverage [email or web address] 04/18/2022, 1:12 PM

## 2022-04-18 NOTE — Progress Notes (Signed)
Referring Physician(s): MacDiramid, S  Supervising Physician: Ruthann Cancer  Patient Status:  Betty [name] - In-pt  Chief Complaint:  Bilateral hydroureteronephrosis s/p R PCN placement by Dr Kathlene Cote on 04/12/22, L PCN placement 7/31 by Dr Earleen Newport  Subjective:  Pt awake, sitting in chair at time of exam. She states her pain is slightly better, but that she has been having diarrhea overnight. She states she wants to leave the hospital ASAP but demonstrates anxiety over being dehydrated.  Allergies: Patient has no known allergies.  Medications: Prior to Admission medications   Medication Sig Start Date End Date Taking? Authorizing Provider  acetaminophen (TYLENOL) 500 MG tablet Take 500 mg by mouth every 4 (four) hours as needed (pain).   Yes [provider]  esomeprazole (NEXIUM) 20 MG capsule Take 20 mg by mouth daily at 12 noon.   Yes [provider]  ibuprofen (ADVIL) 200 MG tablet Take 400 mg by mouth every 4 (four) hours as needed (pain).   Yes [provider]  MELATONIN PO Take 2 tablets by mouth at bedtime as needed (sleep).   Yes [provider]  Trolamine Salicylate (ASPERCREME EX) Apply 1 Application topically daily as needed (pain).   Yes [provider]     Vital Signs: BP 129/81 (BP Location: Left Arm)   Pulse 75   Temp 98.6 F (37 C) (Oral)   Resp 19   Ht '5\' 6"'$  (1.676 m)   Wt 118 lb 2.7 oz (53.6 kg)   SpO2 93%   BMI 19.07 kg/m   Physical Exam Vitals reviewed.  Constitutional:      General: She is not in acute distress.    Appearance: She is ill-appearing.  Cardiovascular:     Rate and Rhythm: Normal rate and regular rhythm.     Pulses: Normal pulses.  Pulmonary:     Effort: Pulmonary effort is normal.  Abdominal:     Tenderness: There is abdominal tenderness. There is no guarding or rebound.  Skin:    General: Skin is warm and dry.     Comments: Right PCN tube in place. Insertion site is unremarkable with no  erythema, no induration, and no drainage at skin. Attached to gravity bag with ~200 mL of yellow fluid in bag. Dressing is clean, dry, and intact  Left PCN tube in place. Insertion site is unremarkable with no erythema, no induration, and no drainage at skin. Attached to gravity bag with ~300 mL of yellow-pink fluid with pink tinge in bag. Dressing is clean, dry, and intact  Neurological:     Mental Status: She is alert and oriented to person, place, and time.  Psychiatric:        Mood and Affect: Mood is anxious.        Behavior: Behavior normal.        Imaging: DG Abd 2 Views  Result Date: 04/17/2022 CLINICAL DATA:  Abdominal pain EXAM: ABDOMEN - 2 VIEW COMPARISON:  04/13/2022 FINDINGS: Bilateral nephrostomy catheters in place. Gaseous distention of the colon, to the level of the splenic flexure. No organomegaly or free air. Scattered vascular calcifications. Visualized lung bases clear. IMPRESSION: Gaseous distention of the colon to the splenic flexure. This likely reflects mild ileus. Degree of distention of the transverse colon increasing since prior study. Electronically Signed   By: Rolm Baptise M.D.   On: 04/17/2022 15:44   IR NEPHROSTOMY PLACEMENT LEFT  Result Date: 04/16/2022 INDICATION: 66 year old female presents for left percutaneous nephrostomy EXAM: ULTRASOUND GUIDED  VENIPUNCTURE IMAGE GUIDED LEFT-SIDED PERCUTANEOUS NEPHROSTOMY COMPARISON:  None MEDICATIONS: 2 g Ancef; The antibiotic was administered in an appropriate time frame prior to skin puncture. ANESTHESIA/SEDATION: Fentanyl 100 mcg IV; Versed 2.0 mg IV Moderate Sedation Time:  12 minutes The patient was continuously monitored during the procedure by the interventional radiology nurse under my direct supervision. CONTRAST:  13m OMNIPAQUE IOHEXOL 300 MG/ML SOLN - administered into the collecting system(s) FLUOROSCOPY TIME:  Fluoroscopy Time: 0 minutes 48 seconds (2 mGy). COMPLICATIONS: None PROCEDURE: Informed written consent  was obtained from the patient after a thorough discussion of the procedural risks, benefits and alternatives. All questions were addressed. Maximal Sterile Barrier Technique was utilized including caps, mask, sterile gowns, sterile gloves, sterile drape, hand hygiene and skin antiseptic. A timeout was performed prior to the initiation of the procedure. Ultrasound survey of the right upper extremity was performed with images stored and sent to PACs. A micropuncture needle was used access the right brachial vein under ultrasound. With venous blood flow returned, an .018 micro wire was passed through the needle into the vein. The needle was removed, and a micropuncture sheath was placed over the wire. The inner dilator and wire were removed, and the catheter was secured in position after attaching to saline flush. We then proceeded with percutaneous nephrostomy. Patient positioned prone position on the fluoroscopy table. Ultrasound survey of the left flank was performed with images stored and sent to PACs. The patient was then prepped and draped in the usual sterile fashion. 1% lidocaine was used to anesthetize the skin and subcutaneous tissues for local anesthesia. A Chiba needle was then used to access a posterior inferior calyx with ultrasound guidance. With spontaneous urine returned through the needle, passage of an 018 micro wire into the collecting system was performed under fluoroscopy. A small incision was made with an 11 blade scalpel, and the needle was removed from the wire. An Accustick system was then advanced over the wire into the collecting system under fluoroscopy. The metal stiffener and inner dilator were removed, and then a sample of fluid was aspirated through the 4 French outer sheath. Bentson wire was passed into the collecting system and the sheath removed. Ten French dilation of the soft tissues was performed. Using modified Seldinger technique, a 10 French pigtail catheter drain was placed  over the Bentson wire. Wire and inner stiffener removed, and the pigtail was formed in the collecting system. Small amount of contrast confirmed position of the catheter. Patient tolerated the procedure well and remained hemodynamically stable throughout. No complications were encountered and no significant blood loss encountered IMPRESSION: Status post ultrasound-guided right upper extremity venipuncture, image guided left percutaneous nephrostomy. Signed, JDulcy Fanny WNadene Rubins RPVI [facility] Electronically Signed   By: JCorrie MckusickD.O.   On: 04/16/2022 08:54   IR RADIOLOGY PERIPHERAL GUIDED IV START  Result Date: 04/16/2022 INDICATION: 66year old female presents for left percutaneous nephrostomy EXAM: ULTRASOUND GUIDED VENIPUNCTURE IMAGE GUIDED LEFT-SIDED PERCUTANEOUS NEPHROSTOMY COMPARISON:  None MEDICATIONS: 2 g Ancef; The antibiotic was administered in an appropriate time frame prior to skin puncture. ANESTHESIA/SEDATION: Fentanyl 100 mcg IV; Versed 2.0 mg IV Moderate Sedation Time:  12 minutes The patient was continuously monitored during the procedure by the interventional radiology nurse under my direct supervision. CONTRAST:  134mOMNIPAQUE IOHEXOL 300 MG/ML SOLN - administered into the collecting system(s) FLUOROSCOPY TIME:  Fluoroscopy Time: 0 minutes 48 seconds (2 mGy). COMPLICATIONS: None PROCEDURE: Informed written consent was obtained  from the patient after a thorough discussion of the procedural risks, benefits and alternatives. All questions were addressed. Maximal Sterile Barrier Technique was utilized including caps, mask, sterile gowns, sterile gloves, sterile drape, hand hygiene and skin antiseptic. A timeout was performed prior to the initiation of the procedure. Ultrasound survey of the right upper extremity was performed with images stored and sent to PACs. A micropuncture needle was used access the right brachial vein  under ultrasound. With venous blood flow returned, an .018 micro wire was passed through the needle into the vein. The needle was removed, and a micropuncture sheath was placed over the wire. The inner dilator and wire were removed, and the catheter was secured in position after attaching to saline flush. We then proceeded with percutaneous nephrostomy. Patient positioned prone position on the fluoroscopy table. Ultrasound survey of the left flank was performed with images stored and sent to PACs. The patient was then prepped and draped in the usual sterile fashion. 1% lidocaine was used to anesthetize the skin and subcutaneous tissues for local anesthesia. A Chiba needle was then used to access a posterior inferior calyx with ultrasound guidance. With spontaneous urine returned through the needle, passage of an 018 micro wire into the collecting system was performed under fluoroscopy. A small incision was made with an 11 blade scalpel, and the needle was removed from the wire. An Accustick system was then advanced over the wire into the collecting system under fluoroscopy. The metal stiffener and inner dilator were removed, and then a sample of fluid was aspirated through the 4 French outer sheath. Bentson wire was passed into the collecting system and the sheath removed. Ten French dilation of the soft tissues was performed. Using modified Seldinger technique, a 10 French pigtail catheter drain was placed over the Bentson wire. Wire and inner stiffener removed, and the pigtail was formed in the collecting system. Small amount of contrast confirmed position of the catheter. Patient tolerated the procedure well and remained hemodynamically stable throughout. No complications were encountered and no significant blood loss encountered IMPRESSION: Status post ultrasound-guided right upper extremity venipuncture, image guided left percutaneous nephrostomy. Signed, Dulcy Fanny. Nadene Rubins, RPVI [name] Electronically Signed   By: Corrie Mckusick D.O.   On: 04/16/2022 08:54   IR US Guide Vasc Access Right  Result Date: 04/16/2022 INDICATION: 66 year old female presents for left percutaneous nephrostomy EXAM: ULTRASOUND GUIDED VENIPUNCTURE IMAGE GUIDED LEFT-SIDED PERCUTANEOUS NEPHROSTOMY COMPARISON:  None MEDICATIONS: 2 g Ancef; The antibiotic was administered in an appropriate time frame prior to skin puncture. ANESTHESIA/SEDATION: Fentanyl 100 mcg IV; Versed 2.0 mg IV Moderate Sedation Time:  12 minutes The patient was continuously monitored during the procedure by the interventional radiology nurse under my direct supervision. CONTRAST:  34m OMNIPAQUE IOHEXOL 300 MG/ML SOLN - administered into the collecting system(s) FLUOROSCOPY TIME:  Fluoroscopy Time: 0 minutes 48 seconds (2 mGy). COMPLICATIONS: None PROCEDURE: Informed written consent was obtained from the patient after a thorough discussion of the procedural risks, benefits and alternatives. All questions were addressed. Maximal Sterile Barrier Technique was utilized including caps, mask, sterile gowns, sterile gloves, sterile drape, hand hygiene and skin antiseptic. A timeout was performed prior to the initiation of the procedure. Ultrasound survey of the right upper extremity was performed with images stored and sent to PACs. A micropuncture needle was used access the right brachial vein under ultrasound. With venous blood flow returned, an .018 micro wire was passed through the needle  into the vein. The needle was removed, and a micropuncture sheath was placed over the wire. The inner dilator and wire were removed, and the catheter was secured in position after attaching to saline flush. We then proceeded with percutaneous nephrostomy. Patient positioned prone position on the fluoroscopy table. Ultrasound survey of the left flank was performed with images stored and sent to PACs. The patient was  then prepped and draped in the usual sterile fashion. 1% lidocaine was used to anesthetize the skin and subcutaneous tissues for local anesthesia. A Chiba needle was then used to access a posterior inferior calyx with ultrasound guidance. With spontaneous urine returned through the needle, passage of an 018 micro wire into the collecting system was performed under fluoroscopy. A small incision was made with an 11 blade scalpel, and the needle was removed from the wire. An Accustick system was then advanced over the wire into the collecting system under fluoroscopy. The metal stiffener and inner dilator were removed, and then a sample of fluid was aspirated through the 4 French outer sheath. Bentson wire was passed into the collecting system and the sheath removed. Ten French dilation of the soft tissues was performed. Using modified Seldinger technique, a 10 French pigtail catheter drain was placed over the Bentson wire. Wire and inner stiffener removed, and the pigtail was formed in the collecting system. Small amount of contrast confirmed position of the catheter. Patient tolerated the procedure well and remained hemodynamically stable throughout. No complications were encountered and no significant blood loss encountered IMPRESSION: Status post ultrasound-guided right upper extremity venipuncture, image guided left percutaneous nephrostomy. Signed, Dulcy Fanny. Nadene Rubins, RPVI [facility] Electronically Signed   By: Corrie Mckusick D.O.   On: 04/16/2022 08:54   CT ABDOMEN PELVIS WO CONTRAST  Result Date: 04/14/2022 CLINICAL DATA:  66 year old female with abdominal and pelvic pain. Patient with acute kidney injury, urinary infection and bilateral hydronephrosis. RIGHT percutaneous nephrostomy tube placed on 04/12/2022. EXAM: CT ABDOMEN AND PELVIS WITHOUT CONTRAST TECHNIQUE: Multidetector CT imaging of the abdomen and pelvis was performed following the  standard protocol without IV contrast. RADIATION DOSE REDUCTION: This exam was performed according to the departmental dose-optimization program which includes automated exposure control, adjustment of the mA and/or kV according to patient size and/or use of iterative reconstruction technique. COMPARISON:  None Available. FINDINGS: Please note that parenchymal and vascular abnormalities may be missed as intravenous contrast was not administered. Lower chest: A small RIGHT pleural effusion, trace LEFT pleural effusion and mild bibasilar atelectasis noted. Hepatobiliary: Liver is unremarkable. Vicarious excretion of contrast in the gallbladder is noted without other definite gallbladder abnormality. There is no evidence of intrahepatic or extrahepatic biliary dilatation. Pancreas: Unremarkable Spleen: Unremarkable Adrenals/Urinary Tract: A RIGHT percutaneous nephrostomy catheter is noted with tip in the RIGHT renal pelvis. A tiny focus of gas within the anterior calyx is noted. Moderate LEFT hydroureteronephrosis to the bladder and RIGHT hydroureter again identified. New mild LEFT perinephric inflammation and small to moderate amount of fluid in the LEFT pararenal spaces noted. A Foley catheter is noted within a collapsed bladder. No urinary calculi are identified. Stomach/Bowel: Stomach is within normal limits. No evidence of bowel wall thickening, distention, or inflammatory changes. No bowel obstruction identified. Vascular/Lymphatic: Aortic atherosclerosis. No enlarged abdominal or pelvic lymph nodes. Reproductive: Unremarkable Other: No ascites, focal collection or pneumoperitoneum. Musculoskeletal: No acute or suspicious bony abnormalities are noted. IMPRESSION: 1. Unchanged moderate LEFT hydroureteronephrosis to the bladder again noted but  new mild LEFT perinephric inflammation and small to moderate amount of LEFT pararenal space fluid, of uncertain clinical significance but may represent increasing  infection/inflammation. The pancreas is unremarkable but correlate with labs. 2. RIGHT percutaneous nephrostomy catheter with tip in the RIGHT renal pelvis. Unchanged RIGHT hydroureter. 3. New small RIGHT pleural effusion, trace LEFT pleural effusion and mild bibasilar atelectasis. 4. Aortic Atherosclerosis (ICD10-I70.0). Electronically Signed   By: Margarette Canada M.D.   On: 04/14/2022 13:42    Labs:  CBC: Recent Labs    04/15/22 0528 04/16/22 0453 04/17/22 0840 04/18/22 0520  WBC 14.8* 17.1* 13.0* 14.5*  HGB 11.5* 11.0* 10.9* 11.5*  HCT 35.0* 33.3* 32.4* 35.4*  PLT 451* 420* 436* 571*     COAGS: Recent Labs    04/12/22 0605  INR 1.1     BMP: Recent Labs    04/15/22 0528 04/16/22 0453 04/17/22 0840 04/18/22 0520  NA 135 137 137 138  K 3.8 3.2* 3.0* 3.6  CL 98 99 99 101  CO2 '24 24 25 27  '$ GLUCOSE 81 83 84 107*  BUN 5* 5* <5* <5*  CALCIUM 8.0* 7.9* 8.1* 8.6*  CREATININE 0.73 0.57 0.48 0.57  GFRNONAA >60 >60 >60 >60     LIVER FUNCTION TESTS: Recent Labs    04/06/22 0555 04/07/22 0629 04/10/22 0541 04/14/22 1436 04/17/22 0840 04/18/22 0520  BILITOT 1.1 0.4  --  0.9  --   --   AST 26 14*  --  16  --   --   ALT 10 9  --  10  --   --   ALKPHOS 78 75  --  59  --   --   PROT 7.5 7.2  --  6.6  --   --   ALBUMIN 3.8 3.7 3.1* 3.2* 2.9* 3.3*     Assessment and Plan:  Betty [name] is a 66 yo female with bilateral hydroureteronephrosis s/p R PCN placement by Dr Kathlene Cote on 04/12/22 and L PCN placement on 7/31 with Dr Earleen Newport. Renal function has improved after R PCN placement. Patient today is complaining of diarrhea and abdominal pain, but states these are being managed by her care team.  WBC 14.5 (13.0) Creatinine 0.57 (0.48) Potassium 3.6 (3.0) Left PCN OP: 2675 mL over 24 hrs R PCN OP: 1800 mL over 24 hours  Plan: Continue TID flushes with 5 cc NS. Record output Q shift. Dressing changes QD or PRN if soiled.  Call IR APP or on call IR MD if difficulty  flushing or sudden change in drain output.  Repeat imaging/possible drain injection once output < 10 mL/QD (excluding flush material). Consideration for drain removal if output is < 10 mL/QD (excluding flush material), pending discussion with the providing surgical service.  Discharge planning: Please contact IR APP or on call IR MD prior to patient d/c to ensure appropriate follow up plans are in place. Typically patient will follow up with IR clinic 10-14 days post d/c for repeat imaging/possible drain injection. IR scheduler will contact patient with date/time of appointment. Patient will need to flush drain QD with 5 cc NS, record output QD, dressing changes every 2-3 days or earlier if soiled.   IR will continue to follow - please call with questions or concerns.       Electronically Signed: Lura Em, PA-C 04/18/2022, 10:19 AM   I spent a total of 15 Minutes at the the patient's bedside AND on the patient's hospital floor or unit, greater than  50% of which was counseling/coordinating care for Bilateral hydroureteronephrosis s/p R PCN placement by Dr Kathlene Cote on 04/12/22 and L PCN placement by Dr Earleen Newport on 7/31

## 2022-04-19 ENCOUNTER — Other Ambulatory Visit (HOSPITAL_COMMUNITY): Payer: Self-pay

## 2022-04-19 DIAGNOSIS — E871 Hypo-osmolality and hyponatremia: Secondary | ICD-10-CM

## 2022-04-19 DIAGNOSIS — Z720 Tobacco use: Secondary | ICD-10-CM | POA: Diagnosis not present

## 2022-04-19 DIAGNOSIS — E876 Hypokalemia: Secondary | ICD-10-CM | POA: Diagnosis not present

## 2022-04-19 DIAGNOSIS — N179 Acute kidney failure, unspecified: Secondary | ICD-10-CM | POA: Diagnosis not present

## 2022-04-19 DIAGNOSIS — F1010 Alcohol abuse, uncomplicated: Secondary | ICD-10-CM

## 2022-04-19 LAB — CBC WITH DIFFERENTIAL/PLATELET
Abs Immature Granulocytes: 0.07 10*3/uL (ref 0.00–0.07)
Basophils Absolute: 0.2 10*3/uL — ABNORMAL HIGH (ref 0.0–0.1)
Basophils Relative: 1 %
Eosinophils Absolute: 0.6 10*3/uL — ABNORMAL HIGH (ref 0.0–0.5)
Eosinophils Relative: 4 %
HCT: 35.6 % — ABNORMAL LOW (ref 36.0–46.0)
Hemoglobin: 11.4 g/dL — ABNORMAL LOW (ref 12.0–15.0)
Immature Granulocytes: 1 %
Lymphocytes Relative: 14 %
Lymphs Abs: 2.1 10*3/uL (ref 0.7–4.0)
MCH: 32.3 pg (ref 26.0–34.0)
MCHC: 32 g/dL (ref 30.0–36.0)
MCV: 100.8 fL — ABNORMAL HIGH (ref 80.0–100.0)
Monocytes Absolute: 1.4 10*3/uL — ABNORMAL HIGH (ref 0.1–1.0)
Monocytes Relative: 10 %
Neutro Abs: 10.5 10*3/uL — ABNORMAL HIGH (ref 1.7–7.7)
Neutrophils Relative %: 70 %
Platelets: 619 10*3/uL — ABNORMAL HIGH (ref 150–400)
RBC: 3.53 MIL/uL — ABNORMAL LOW (ref 3.87–5.11)
RDW: 14 % (ref 11.5–15.5)
WBC: 14.9 10*3/uL — ABNORMAL HIGH (ref 4.0–10.5)
nRBC: 0 % (ref 0.0–0.2)

## 2022-04-19 LAB — RENAL FUNCTION PANEL
Albumin: 3.7 g/dL (ref 3.5–5.0)
Anion gap: 10 (ref 5–15)
BUN: 5 mg/dL — ABNORMAL LOW (ref 8–23)
CO2: 26 mmol/L (ref 22–32)
Calcium: 9.2 mg/dL (ref 8.9–10.3)
Chloride: 102 mmol/L (ref 98–111)
Creatinine, Ser: 0.66 mg/dL (ref 0.44–1.00)
GFR, Estimated: >60 mL/min (ref >60)
Glucose, Bld: 86 mg/dL (ref 70–99)
Phosphorus: 2.4 mg/dL — ABNORMAL LOW (ref 2.5–4.6)
Potassium: 4.1 mmol/L (ref 3.5–5.1)
Sodium: 138 mmol/L (ref 135–145)

## 2022-04-19 LAB — CYTOLOGY - NON PAP

## 2022-04-19 LAB — MAGNESIUM: Magnesium: 1.3 mg/dL — ABNORMAL LOW (ref 1.7–2.4)

## 2022-04-19 MED ORDER — HYDROXYZINE HCL 25 MG PO TABS
25.0000 mg | ORAL_TABLET | Freq: Three times a day (TID) | ORAL | 0 refills | Status: AC | PRN
  Filled 2022-04-19: qty 90, 30d supply, fill #0

## 2022-04-19 MED ORDER — MAGNESIUM OXIDE -MG SUPPLEMENT 400 (240 MG) MG PO TABS
400.0000 mg | ORAL_TABLET | Freq: Two times a day (BID) | ORAL | Status: DC
Start: 2022-04-19 — End: 2022-04-19
  Administered 2022-04-19: 400 mg via ORAL
  Filled 2022-04-19: qty 1

## 2022-04-19 NOTE — TOC Transition Note (Signed)
Transition of Care [facility]) - CM/SW Discharge Note   Patient Details  Name: Betty [name] MRN: [id number] Date of Birth: 04-Mar-1956  Transition of Care [facility] CM/SW Contact:  Servando Snare, LCSW Phone Number: 04/19/2022, 10:02 AM   Clinical Narrative:    Patient agreeable to home health. Patient to dc home with home health. HHrN arranged with [facility].    Final next level of care: [place] Barriers to Discharge: No Barriers Identified   Patient Goals and CMS Choice Patient states their goals for this hospitalization and ongoing recovery are:: Home CMS Medicare.gov Compare Post Acute Care list provided to:: Patient Choice offered to / list presented to : Patient  Discharge Placement              Patient chooses bed at:  [facility])        Discharge Plan and Services   Discharge Planning Services: CM Consult            DME Arranged: N/A DME Agency: NA       HH Arranged: RN [place] Agency: [place] Date [facility] Agency Contacted: 04/19/22 Time [place]: 1001 Representative spoke with at [place]: [place] Determinants of Health ([place]) Interventions     Readmission Risk Interventions     No data to display

## 2022-04-19 NOTE — Progress Notes (Signed)
WBC still elevated Feeling much better Phone number given I will see as outpatient with Dr Tresa Moore

## 2022-04-19 NOTE — Discharge Summary (Signed)
Physician Discharge Summary  Betty ARGYLE OXB:[id number] DOB: 1956-06-28 DOA: 04/06/2022  PCP: Pcp, No  Admit date: 04/06/2022 Discharge date: 04/19/2022 Admitted From: Home Disposition: Home Recommendations for Outpatient Follow-up:  Follow up with PCP in 1 week Urology and IR to arrange outpatient follow-up Check CBC with differential and CMP in 1 to 2 weeks Please follow up on the following pending results: Serologic work-up for retroperitoneal fibrosis  Home Health: None Equipment/Devices: None  Discharge Condition: Stable CODE STATUS: Full code  Follow-up Information     primary care physician. Schedule an appointment as soon as possible for a visit.          Bjorn Loser, MD. Schedule an appointment as soon as possible for a visit in 1 week(s).   Specialty: Urology Contact information: [place] [address] [phone]                 Hospital course 66 year old F with PMH of EtOH use, tobacco use, HTN and GERD presenting with right flank pain and admitted with AKI.  CT abdomen and pelvis showed mild bilateral hydroureteronephrosis, circumferential bladder wall thickening suggestive for cystitis and marked low-density material measuring 6 mm in thickness surrounding the proximal and right mid ureteral.  Urine culture obtained.  Patient was started on IV ceftriaxone.  Urology and IR consulted.  Patient had right PCN on 7/28 with improvement in renal function.  Then she developed left flank pain.  Repeat CT abdomen and pelvis showed unchanged left-sided hydroureteronephrosis with new left perinephric inflammatory change.  Patient had left PCN on 7/31.  ID consulted on 8/1 and discontinued antibiotics.  Patient patient had IV ceftriaxone for a total of 11 days from 7/22-8/1.   Patient continued to endorse significant abdominal pain.  KUB suggested ileus.  Abdominal pain improved after discontinuing opiates.  Eventually, she felt well and ready to go  home.  She was cleared for discharge by urology for outpatient follow-up.  She is educated on drain management for PCNs.  Foley catheter discontinued.  Serologic labs for possible retroperitoneal fibrosis pending.  See individual problem list below for more.   Problems addressed during this hospitalization Principal Problem:   AKI (acute kidney injury) ([place]) Active Problems:   Cystitis   Hydroureteronephrosis   Tobacco abuse   Alcohol abuse   Hyponatremia   Hypokalemia   AKI in the setting of bilateral hydroureteronephrosis of unclear etiology but concern about retroperitoneal fibrosis and pyelonephritis: AKI resolved. -S/p right PCN on 7/28 and left PCN on 7/31 -IgG4-normal, CRP 4.1 and ESR 50.   -Follow additional serologies for retroperitoneal fibrosis -IV ceftriaxone from 7/22-8/1.  Discontinued per ID. -Outpatient follow-up with urology -Educated on drain management -Counseled on smoking cessation   Low abdominal pain/ileus:  KUB on 8/2 suggests ileus.  Resolved.   Constipation/diarrhea: Initially had constipation and started on bowel regimen and developed diarrhea.  Diarrhea improved.   Hypokalemia/hypomagnesemia/hyponatremia hypocalcemia: Hyponatremia, hypocalcemia and hypokalemia resolved.  Received IV magnesium sulfate prior to discharge.    Leukocytosis/bandemia: Improving. -Recheck in 1 to 2 weeks   Alcohol use disorder: Should be outside the window for withdrawal -Continue vitamins -Counseled on alcohol cessation.   Tobacco abuse:  -Counseled on smoking cessation. -Continue with nicotine patch   Essential hypertension: Normotensive. -Continue amlodipine and low-dose metoprolol   Anion gap metabolic acidosis: Resolved.   Anxiety: Appears very anxious. -Increased Atarax to 25 mg every 8 hours as needed  Increased nutrient needs Nutrition Problem: Increased nutrient  needs Etiology: acute illness, chronic illness Signs/Symptoms: estimated  needs Interventions: Education     Vital signs Vitals:   04/18/22 0630 04/18/22 1445 04/18/22 2113 04/19/22 0527  BP: 129/81 130/79 119/86 136/80  Pulse: 75 81 73 78  Temp: 98.6 F (37 C) 98.2 F (36.8 C) 98.4 F (36.9 C) 97.8 F (36.6 C)  Resp: 19 20 18 18   Height:      Weight:      SpO2: 93% 95% 95% 99%  TempSrc: Oral Oral Oral Oral  BMI (Calculated):         Discharge exam  GENERAL: No apparent distress.  Nontoxic. HEENT: MMM.  Vision and hearing grossly intact.  NECK: Supple.  No apparent JVD.  RESP:  No IWOB.  Fair aeration bilaterally. CVS:  RRR. Heart sounds normal.  ABD/GI/GU: BS+. Abd soft, NTND.  Bilateral PCN with clear urine. MSK/EXT:  Moves extremities. No apparent deformity. No edema.  SKIN: no apparent skin lesion or wound NEURO: Awake and alert. Oriented appropriately.  No apparent focal neuro deficit. PSYCH: Calm. Normal affect.   Discharge Instructions Discharge Instructions     Call MD for:  extreme fatigue   Complete by: As directed    Call MD for:  persistant nausea and vomiting   Complete by: As directed    Call MD for:  redness, tenderness, or signs of infection (pain, swelling, redness, odor or green/yellow discharge around incision site)   Complete by: As directed    Call MD for:  severe uncontrolled pain   Complete by: As directed    Call MD for:  temperature >100.4   Complete by: As directed    Diet - low sodium heart healthy   Complete by: As directed    Discharge instructions   Complete by: As directed    It has been a pleasure taking care of you!  You were hospitalized due to acute injury and some degree of urinary obstruction for which you have been treated with drain placement.  Your kidney function recovered.  Continue flushing the drain 3 times a day as instructed by interventional radiology, who will arrange outpatient follow-up for further management of drain.  We also recommend follow-up with urology per their recommednation.   Strongly recommend avoiding any over-the-counter pain medication other than plain Tylenol.  Please review your new medication list and the directions on your medications before you take them.  Follow-up with your primary care doctor in 1 to 2 weeks or sooner if needed.  It is important that you quit smoking cigarettes.  You may use nicotine patch to help you quit smoking.  Nicotine patch is available over-the-counter.  You may also discuss other options to help you quit smoking with your primary care doctor. You can also talk to professional counselors at 1-800-QUIT-NOW (907)524-8983) for free smoking cessation counseling.     Take care,   Increase activity slowly   Complete by: As directed    No wound care   Complete by: As directed       Allergies as of 04/19/2022   No Known Allergies      Medication List     STOP taking these medications    ibuprofen 200 MG tablet Commonly known as: ADVIL       TAKE these medications    acetaminophen 500 MG tablet Commonly known as: TYLENOL Take 500 mg by mouth every 4 (four) hours as needed (pain).   amLODipine 10 MG tablet Commonly known as: NORVASC Take  1 tablet (10 mg total) by mouth daily.   ASPERCREME EX Apply 1 Application topically daily as needed (pain).   esomeprazole 20 MG capsule Commonly known as: NEXIUM Take 20 mg by mouth daily at 12 noon.   hydrOXYzine 25 MG tablet Commonly known as: ATARAX Take 1 tablet (25 mg total) by mouth 3 (three) times daily as needed for anxiety.   MELATONIN PO Take 2 tablets by mouth at bedtime as needed (sleep).   metoprolol tartrate 25 MG tablet Commonly known as: LOPRESSOR Take 1 tablet (25 mg total) by mouth 2 (two) times daily.   multivitamin with minerals Tabs tablet Take 1 tablet by mouth daily.   nicotine 21 mg/24hr patch Commonly known as: NICODERM CQ - dosed in mg/24 hours Place 1 patch (21 mg total) onto the skin daily.   polyethylene glycol powder 17  GM/SCOOP powder Commonly known as: MiraLax Take 17 g by mouth 2 (two) times daily as needed for mild constipation.   senna-docusate 8.6-50 MG tablet Commonly known as: Senokot-S Take 1 tablet by mouth 2 (two) times daily between meals as needed for moderate constipation.   sodium chloride flush 0.9 % Soln Commonly known as: NS Use 5 mLs by intracatheter route every 8 hours. Single use syringes   thiamine 100 MG tablet Commonly known as: VITAMIN B1 Take 1 tablet (100 mg total) by mouth daily.        Consultations: Urology IR  Procedures/Studies: Right and left percutaneous nephrostomy tube   DG Abd 2 Views  Result Date: 04/17/2022 CLINICAL DATA:  Abdominal pain EXAM: ABDOMEN - 2 VIEW COMPARISON:  04/13/2022 FINDINGS: Bilateral nephrostomy catheters in place. Gaseous distention of the colon, to the level of the splenic flexure. No organomegaly or free air. Scattered vascular calcifications. Visualized lung bases clear. IMPRESSION: Gaseous distention of the colon to the splenic flexure. This likely reflects mild ileus. Degree of distention of the transverse colon increasing since prior study. Electronically Signed   By: Rolm Baptise M.D.   On: 04/17/2022 15:44   IR NEPHROSTOMY PLACEMENT LEFT  Result Date: 04/16/2022 INDICATION: 66 year old female presents for left percutaneous nephrostomy EXAM: ULTRASOUND GUIDED VENIPUNCTURE IMAGE GUIDED LEFT-SIDED PERCUTANEOUS NEPHROSTOMY COMPARISON:  None MEDICATIONS: 2 g Ancef; The antibiotic was administered in an appropriate time frame prior to skin puncture. ANESTHESIA/SEDATION: Fentanyl 100 mcg IV; Versed 2.0 mg IV Moderate Sedation Time:  12 minutes The patient was continuously monitored during the procedure by the interventional radiology nurse under my direct supervision. CONTRAST:  51m OMNIPAQUE IOHEXOL 300 MG/ML SOLN - administered into the collecting system(s) FLUOROSCOPY TIME:  Fluoroscopy Time: 0 minutes 48 seconds (2 mGy). COMPLICATIONS:  None PROCEDURE: Informed written consent was obtained from the patient after a thorough discussion of the procedural risks, benefits and alternatives. All questions were addressed. Maximal Sterile Barrier Technique was utilized including caps, mask, sterile gowns, sterile gloves, sterile drape, hand hygiene and skin antiseptic. A timeout was performed prior to the initiation of the procedure. Ultrasound survey of the right upper extremity was performed with images stored and sent to PACs. A micropuncture needle was used access the right brachial vein under ultrasound. With venous blood flow returned, an .018 micro wire was passed through the needle into the vein. The needle was removed, and a micropuncture sheath was placed over the wire. The inner dilator and wire were removed, and the catheter was secured in position after attaching to saline flush. We then proceeded with percutaneous nephrostomy. Patient positioned prone position on the  fluoroscopy table. Ultrasound survey of the left flank was performed with images stored and sent to PACs. The patient was then prepped and draped in the usual sterile fashion. 1% lidocaine was used to anesthetize the skin and subcutaneous tissues for local anesthesia. A Chiba needle was then used to access a posterior inferior calyx with ultrasound guidance. With spontaneous urine returned through the needle, passage of an 018 micro wire into the collecting system was performed under fluoroscopy. A small incision was made with an 11 blade scalpel, and the needle was removed from the wire. An Accustick system was then advanced over the wire into the collecting system under fluoroscopy. The metal stiffener and inner dilator were removed, and then a sample of fluid was aspirated through the 4 French outer sheath. Bentson wire was passed into the collecting system and the sheath removed. Ten French dilation of the soft tissues was performed. Using modified Seldinger technique, a 10  French pigtail catheter drain was placed over the Bentson wire. Wire and inner stiffener removed, and the pigtail was formed in the collecting system. Small amount of contrast confirmed position of the catheter. Patient tolerated the procedure well and remained hemodynamically stable throughout. No complications were encountered and no significant blood loss encountered IMPRESSION: Status post ultrasound-guided right upper extremity venipuncture, image guided left percutaneous nephrostomy. Signed, Dulcy Fanny. Nadene Rubins, RPVI [facility] Radiology Electronically Signed   By: Corrie Mckusick D.O.   On: 04/16/2022 08:54   IR RADIOLOGY PERIPHERAL GUIDED IV START  Result Date: 04/16/2022 INDICATION: 66 year old female presents for left percutaneous nephrostomy EXAM: ULTRASOUND GUIDED VENIPUNCTURE IMAGE GUIDED LEFT-SIDED PERCUTANEOUS NEPHROSTOMY COMPARISON:  None MEDICATIONS: 2 g Ancef; The antibiotic was administered in an appropriate time frame prior to skin puncture. ANESTHESIA/SEDATION: Fentanyl 100 mcg IV; Versed 2.0 mg IV Moderate Sedation Time:  12 minutes The patient was continuously monitored during the procedure by the interventional radiology nurse under my direct supervision. CONTRAST:  67m OMNIPAQUE IOHEXOL 300 MG/ML SOLN - administered into the collecting system(s) FLUOROSCOPY TIME:  Fluoroscopy Time: 0 minutes 48 seconds (2 mGy). COMPLICATIONS: None PROCEDURE: Informed written consent was obtained from the patient after a thorough discussion of the procedural risks, benefits and alternatives. All questions were addressed. Maximal Sterile Barrier Technique was utilized including caps, mask, sterile gowns, sterile gloves, sterile drape, hand hygiene and skin antiseptic. A timeout was performed prior to the initiation of the procedure. Ultrasound survey of the right upper extremity was performed with images stored and sent to PACs. A micropuncture needle  was used access the right brachial vein under ultrasound. With venous blood flow returned, an .018 micro wire was passed through the needle into the vein. The needle was removed, and a micropuncture sheath was placed over the wire. The inner dilator and wire were removed, and the catheter was secured in position after attaching to saline flush. We then proceeded with percutaneous nephrostomy. Patient positioned prone position on the fluoroscopy table. Ultrasound survey of the left flank was performed with images stored and sent to PACs. The patient was then prepped and draped in the usual sterile fashion. 1% lidocaine was used to anesthetize the skin and subcutaneous tissues for local anesthesia. A Chiba needle was then used to access a posterior inferior calyx with ultrasound guidance. With spontaneous urine returned through the needle, passage of an 018 micro wire into the collecting system was performed under fluoroscopy. A small incision was made with an 11 blade scalpel, and the  needle was removed from the wire. An Accustick system was then advanced over the wire into the collecting system under fluoroscopy. The metal stiffener and inner dilator were removed, and then a sample of fluid was aspirated through the 4 French outer sheath. Bentson wire was passed into the collecting system and the sheath removed. Ten French dilation of the soft tissues was performed. Using modified Seldinger technique, a 10 French pigtail catheter drain was placed over the Bentson wire. Wire and inner stiffener removed, and the pigtail was formed in the collecting system. Small amount of contrast confirmed position of the catheter. Patient tolerated the procedure well and remained hemodynamically stable throughout. No complications were encountered and no significant blood loss encountered IMPRESSION: Status post ultrasound-guided right upper extremity venipuncture, image guided left percutaneous nephrostomy. Signed, Dulcy Fanny. Nadene Rubins, RPVI [facility] Radiology Electronically Signed   By: Corrie Mckusick D.O.   On: 04/16/2022 08:54   IR US Guide Vasc Access Right  Result Date: 04/16/2022 INDICATION: 66 year old female presents for left percutaneous nephrostomy EXAM: ULTRASOUND GUIDED VENIPUNCTURE IMAGE GUIDED LEFT-SIDED PERCUTANEOUS NEPHROSTOMY COMPARISON:  None MEDICATIONS: 2 g Ancef; The antibiotic was administered in an appropriate time frame prior to skin puncture. ANESTHESIA/SEDATION: Fentanyl 100 mcg IV; Versed 2.0 mg IV Moderate Sedation Time:  12 minutes The patient was continuously monitored during the procedure by the interventional radiology nurse under my direct supervision. CONTRAST:  31m OMNIPAQUE IOHEXOL 300 MG/ML SOLN - administered into the collecting system(s) FLUOROSCOPY TIME:  Fluoroscopy Time: 0 minutes 48 seconds (2 mGy). COMPLICATIONS: None PROCEDURE: Informed written consent was obtained from the patient after a thorough discussion of the procedural risks, benefits and alternatives. All questions were addressed. Maximal Sterile Barrier Technique was utilized including caps, mask, sterile gowns, sterile gloves, sterile drape, hand hygiene and skin antiseptic. A timeout was performed prior to the initiation of the procedure. Ultrasound survey of the right upper extremity was performed with images stored and sent to PACs. A micropuncture needle was used access the right brachial vein under ultrasound. With venous blood flow returned, an .018 micro wire was passed through the needle into the vein. The needle was removed, and a micropuncture sheath was placed over the wire. The inner dilator and wire were removed, and the catheter was secured in position after attaching to saline flush. We then proceeded with percutaneous nephrostomy. Patient positioned prone position on the fluoroscopy table. Ultrasound survey of the left flank was performed with images stored and  sent to PACs. The patient was then prepped and draped in the usual sterile fashion. 1% lidocaine was used to anesthetize the skin and subcutaneous tissues for local anesthesia. A Chiba needle was then used to access a posterior inferior calyx with ultrasound guidance. With spontaneous urine returned through the needle, passage of an 018 micro wire into the collecting system was performed under fluoroscopy. A small incision was made with an 11 blade scalpel, and the needle was removed from the wire. An Accustick system was then advanced over the wire into the collecting system under fluoroscopy. The metal stiffener and inner dilator were removed, and then a sample of fluid was aspirated through the 4 French outer sheath. Bentson wire was passed into the collecting system and the sheath removed. Ten French dilation of the soft tissues was performed. Using modified Seldinger technique, a 10 French pigtail catheter drain was placed over the Bentson wire. Wire and inner stiffener removed, and the pigtail was formed in  the collecting system. Small amount of contrast confirmed position of the catheter. Patient tolerated the procedure well and remained hemodynamically stable throughout. No complications were encountered and no significant blood loss encountered IMPRESSION: Status post ultrasound-guided right upper extremity venipuncture, image guided left percutaneous nephrostomy. Signed, Dulcy Fanny. Nadene Rubins, RPVI [facility] Radiology Electronically Signed   By: Corrie Mckusick D.O.   On: 04/16/2022 08:54   CT ABDOMEN PELVIS WO CONTRAST  Result Date: 04/14/2022 CLINICAL DATA:  66 year old female with abdominal and pelvic pain. Patient with acute kidney injury, urinary infection and bilateral hydronephrosis. RIGHT percutaneous nephrostomy tube placed on 04/12/2022. EXAM: CT ABDOMEN AND PELVIS WITHOUT CONTRAST TECHNIQUE: Multidetector CT imaging of the abdomen and  pelvis was performed following the standard protocol without IV contrast. RADIATION DOSE REDUCTION: This exam was performed according to the departmental dose-optimization program which includes automated exposure control, adjustment of the mA and/or kV according to patient size and/or use of iterative reconstruction technique. COMPARISON:  None Available. FINDINGS: Please note that parenchymal and vascular abnormalities may be missed as intravenous contrast was not administered. Lower chest: A small RIGHT pleural effusion, trace LEFT pleural effusion and mild bibasilar atelectasis noted. Hepatobiliary: Liver is unremarkable. Vicarious excretion of contrast in the gallbladder is noted without other definite gallbladder abnormality. There is no evidence of intrahepatic or extrahepatic biliary dilatation. Pancreas: Unremarkable Spleen: Unremarkable Adrenals/Urinary Tract: A RIGHT percutaneous nephrostomy catheter is noted with tip in the RIGHT renal pelvis. A tiny focus of gas within the anterior calyx is noted. Moderate LEFT hydroureteronephrosis to the bladder and RIGHT hydroureter again identified. New mild LEFT perinephric inflammation and small to moderate amount of fluid in the LEFT pararenal spaces noted. A Foley catheter is noted within a collapsed bladder. No urinary calculi are identified. Stomach/Bowel: Stomach is within normal limits. No evidence of bowel wall thickening, distention, or inflammatory changes. No bowel obstruction identified. Vascular/Lymphatic: Aortic atherosclerosis. No enlarged abdominal or pelvic lymph nodes. Reproductive: Unremarkable Other: No ascites, focal collection or pneumoperitoneum. Musculoskeletal: No acute or suspicious bony abnormalities are noted. IMPRESSION: 1. Unchanged moderate LEFT hydroureteronephrosis to the bladder again noted but new mild LEFT perinephric inflammation and small to moderate amount of LEFT pararenal space fluid, of uncertain clinical significance but  may represent increasing infection/inflammation. The pancreas is unremarkable but correlate with labs. 2. RIGHT percutaneous nephrostomy catheter with tip in the RIGHT renal pelvis. Unchanged RIGHT hydroureter. 3. New small RIGHT pleural effusion, trace LEFT pleural effusion and mild bibasilar atelectasis. 4. Aortic Atherosclerosis (ICD10-I70.0). Electronically Signed   By: Margarette Canada M.D.   On: 04/14/2022 13:42   DG Abd 1 View  Result Date: 04/13/2022 CLINICAL DATA:  [id number] generalized abdominal pain. Right nephrostomy tube. EXAM: ABDOMEN - 1 VIEW COMPARISON:  April 10, 2022 FINDINGS: There has been interval right PCN tube insertion. There is mild gaseous distention of the colon seen and has mildly increased in the interim. Bowel-gas pattern is nonobstructive. Phleboliths in the pelvis. IMPRESSION: Right PCN tube and is new. Mild gaseous distention of the colon and has increased in the interim without evidence of bowel obstruction. Electronically Signed   By: Frazier Richards M.D.   On: 04/13/2022 15:14   IR NEPHROSTOMY PLACEMENT RIGHT  Result Date: 04/12/2022 CLINICAL DATA:  Bilateral hydronephrosis, right greater than left. Acute kidney injury and urinary infection. Request for right percutaneous nephrostomy tube. EXAM: 1. ULTRASOUND GUIDANCE FOR PUNCTURE OF THE RIGHT RENAL COLLECTING SYSTEM. 2. Right PERCUTANEOUS NEPHROSTOMY TUBE PLACEMENT.  COMPARISON:  Renal ultrasound on 04/10/2022 and CT of the abdomen and pelvis on 04/06/2022. ANESTHESIA/SEDATION: Moderate (conscious) sedation was employed during this procedure. A total of Versed 2.0 mg and Fentanyl 100 mcg was administered intravenously by radiology nursing. Moderate Sedation Time: 14 minutes. The patient's level of consciousness and vital signs were monitored continuously by radiology nursing throughout the procedure under my direct supervision. CONTRAST:  Ten ml Omnipaque 300 MEDICATIONS: No additional medications. FLUOROSCOPY TIME:  3.0 mGy.  PROCEDURE: The procedure, risks, benefits, and alternatives were explained to the patient. Questions regarding the procedure were encouraged and answered. The patient understands and consents to the procedure. A time-out was performed prior to initiating the procedure. The right flank region was prepped with chlorhexidine in a sterile fashion, and a sterile drape was applied covering the operative field. A sterile gown and sterile gloves were used for the procedure. Local anesthesia was provided with 1% Lidocaine. Ultrasound was used to localize the right kidney. Under direct ultrasound guidance, a 21 gauge needle was advanced into the renal collecting system. Ultrasound image documentation was performed. Aspiration of urine sample was performed followed by contrast injection. A transitional dilator was advanced over a guidewire. Percutaneous tract dilatation was then performed over the guidewire. A 10-French percutaneous nephrostomy tube was then advanced and formed in the collecting system. Catheter position was confirmed by fluoroscopy after contrast injection. The catheter was attached to a gravity drainage bag and secured at the skin with a Prolene retention suture. COMPLICATIONS: None. FINDINGS: Ultrasound demonstrates moderate hydronephrosis of the right kidney. A nephrostomy tube was placed via lower pole caliceal access and formed at the level of the renal pelvis. There is return of blood tinged urine. A urine sample was sent for culture analysis. IMPRESSION: Right percutaneous nephrostomy tube placement. A 10 French catheter was placed and formed at the level of the renal pelvis. This was attached to gravity bag drainage. A urine sample was sent for culture analysis. Electronically Signed   By: Aletta Edouard M.D.   On: 04/12/2022 15:27   DG Abd 1 View  Result Date: 04/10/2022 CLINICAL DATA:  Lower right side abdominal pain. EXAM: ABDOMEN - 1 VIEW COMPARISON:  CT abdomen and pelvis 04/06/2022; renal  ultrasound 04/10/2022 FINDINGS: Bowel gas is seen within nondistended loops of small and large bowel. Bilateral internal iliac arterial vascular calcifications are again seen. There are numerous vascular phleboliths overlying the pelvis as seen on prior CT. No definitive renal stone is seen. No portal venous gas or pneumatosis. No acute skeletal abnormality. IMPRESSION: 1. Nonobstructed bowel-gas pattern. 2. Note is made of bilateral hydroureteronephrosis on prior CT including diffuse proximal 2/3 of the right ureteral edema and wall thickening. Electronically Signed   By: Yvonne Kendall M.D.   On: 04/10/2022 14:38   US RENAL  Result Date: 04/10/2022 CLINICAL DATA:  Acute kidney injury EXAM: RENAL / URINARY TRACT ULTRASOUND COMPLETE COMPARISON:  None Available. FINDINGS: Right Kidney: Renal measurements: 11.8 by 5.9 by 7.3 cm = volume: 263 mL. Moderate hydronephrosis and proximal hydroureter. Left Kidney: Renal measurements: 11.0 by 6.5 by 5.9 cm = volume: 222 mL. Moderate hydronephrosis and proximal hydroureter. Bladder: Appears normal for degree of bladder distention. Ureteral jets were not observed during a five-minute observation. Other: None. IMPRESSION: 1. Moderate hydronephrosis and bilateral proximal hydroureter. Ureteral jets in the urinary bladder were not identified during a five-minute observation. Electronically Signed   By: Van Clines M.D.   On: 04/10/2022 14:15   CT Abdomen Pelvis  W Contrast  Result Date: 04/06/2022 CLINICAL DATA:  Abdominal pain.  Flank pain. EXAM: CT ABDOMEN AND PELVIS WITH CONTRAST TECHNIQUE: Multidetector CT imaging of the abdomen and pelvis was performed using the standard protocol following bolus administration of intravenous contrast. RADIATION DOSE REDUCTION: This exam was performed according to the departmental dose-optimization program which includes automated exposure control, adjustment of the mA and/or kV according to patient size and/or use of iterative  reconstruction technique. CONTRAST:  136m OMNIPAQUE IOHEXOL 300 MG/ML  SOLN COMPARISON:  None Available. FINDINGS: Lower chest: Emphysema. Hepatobiliary: No suspicious focal abnormality within the liver parenchyma. There is no evidence for gallstones, gallbladder wall thickening, or pericholecystic fluid. No intrahepatic or extrahepatic biliary dilation. Pancreas: No focal mass lesion. No dilatation of the main duct. No intraparenchymal cyst. No peripancreatic edema. Spleen: No splenomegaly. No focal mass lesion. Adrenals/Urinary Tract: No adrenal nodule or mass. Mild bilateral hydroureteronephrosis. There is a marked collar of low-density material measuring 6 mm surrounding the proximal and mid right ureter. Distal ureters appear decompressed with prominent enhancement. There is circumferential bladder wall thickening. Stomach/Bowel: Stomach is unremarkable. No gastric wall thickening. No evidence of outlet obstruction. Duodenum is normally positioned as is the ligament of Treitz. No small bowel wall thickening. No small bowel dilatation. The terminal ileum is normal. The appendix is not well visualized, but there is no edema or inflammation in the region of the cecum. No gross colonic mass. No colonic wall thickening. Vascular/Lymphatic: There is moderate atherosclerotic calcification of the abdominal aorta without aneurysm. There is no gastrohepatic or hepatoduodenal ligament lymphadenopathy. No retroperitoneal or mesenteric lymphadenopathy. No pelvic sidewall lymphadenopathy. Reproductive: Unremarkable. Other: No intraperitoneal free fluid. Musculoskeletal: No worrisome lytic or sclerotic osseous abnormality. IMPRESSION: 1. Mild bilateral hydroureteronephrosis. Both ureters are dilated down to the pelvis just proximal to the UVJ on each side where the ureteral urothelium may hyper enhance. This is associated with circumferential bladder wall thickening suggesting cystitis. 2. Marked collar of low-density  material measuring 6 mm in thickness surrounding the proximal and mid right ureter. This probably reflects marked edema of the ureteral wall although contained periureteric edema or lymphoma cannot be excluded. Underlying transitional cell carcinoma is a consideration. 3. Aortic Atherosclerosis (ICD10-I70.0) and Emphysema (ICD10-J43.9). Electronically Signed   By: EMisty StanleyM.D.   On: 04/06/2022 08:00   DG Chest 2 View  Result Date: 04/06/2022 CLINICAL DATA:  Right flank pain for 1 month EXAM: CHEST - 2 VIEW COMPARISON:  None Available. FINDINGS: Artifact from EKG leads. Normal heart size and mediastinal contours. No acute infiltrate or edema. No effusion or pneumothorax. No acute osseous findings. IMPRESSION: No active cardiopulmonary disease. Electronically Signed   By: JJorje GuildM.D.   On: 04/06/2022 07:22       The results of significant diagnostics from this hospitalization (including imaging, microbiology, ancillary and laboratory) are listed below for reference.     Microbiology: Recent Results (from the past 240 hour(s))  Urine Culture     Status: None   Collection Time: 04/12/22  3:01 PM   Specimen: Urine, Random  Result Value Ref Range Status   Specimen Description   Final    URINE, RANDOM Performed at [facility], [place] [address]   Special Requests   Final    RIGHT KIDNEY Performed at [facility] [id number] [place] [address]   Culture   Final    NO GROWTH [facility] at [facility], [place] [address], [place], [place] [address]    Report Status 04/13/2022 FINAL  Final     Labs:  CBC: Recent Labs  Lab 04/15/22 0528 04/16/22 0453 04/17/22 0840 04/18/22 0520 04/19/22 0502  WBC 14.8* 17.1* 13.0* 14.5* 14.9*  NEUTROABS  --   --  9.5*  --  10.5*  HGB 11.5* 11.0* 10.9* 11.5* 11.4*  HCT 35.0* 33.3* 32.4* 35.4* 35.6*  MCV 99.4 100.0 99.1 100.0 100.8*  PLT 451* 420* 436*  571* 619*   BMP &GFR Recent Labs  Lab 04/15/22 0528 04/16/22 0453 04/17/22 0840 04/18/22 0520 04/19/22 0502  NA 135 137 137 138 138  K 3.8 3.2* 3.0* 3.6 4.1  CL 98 99 99 101 102  CO2 24 24 25 27 26   GLUCOSE 81 83 84 107* 86  BUN 5* 5* <5* <5* 5*  CREATININE 0.73 0.57 0.48 0.57 0.66  CALCIUM 8.0* 7.9* 8.1* 8.6* 9.2  MG 1.5* 1.8 1.3* 1.8 1.3*  PHOS  --   --  2.4* 2.5 2.4*   Estimated Creatinine Clearance: 59.3 mL/min (by C-G formula based on SCr of 0.66 mg/dL). Liver & Pancreas: Recent Labs  Lab 04/14/22 1436 04/17/22 0840 04/18/22 0520 04/19/22 0502  AST 16  --   --   --   ALT 10  --   --   --   ALKPHOS 59  --   --   --   BILITOT 0.9  --   --   --   PROT 6.6  --   --   --   ALBUMIN 3.2* 2.9* 3.3* 3.7   Recent Labs  Lab 04/15/22 0528  LIPASE 33   No results for input(s): "AMMONIA" in the last 168 hours. Diabetic: No results for input(s): "HGBA1C" in the last 72 hours. No results for input(s): "GLUCAP" in the last 168 hours. Cardiac Enzymes: No results for input(s): "CKTOTAL", "CKMB", "CKMBINDEX", "TROPONINI" in the last 168 hours. No results for input(s): "PROBNP" in the last 8760 hours. Coagulation Profile: No results for input(s): "INR", "PROTIME" in the last 168 hours. Thyroid Function Tests: Recent Labs    04/18/22 1425  TSH 3.514   Lipid Profile: No results for input(s): "CHOL", "HDL", "LDLCALC", "TRIG", "CHOLHDL", "LDLDIRECT" in the last 72 hours. Anemia Panel: No results for input(s): "VITAMINB12", "FOLATE", "FERRITIN", "TIBC", "IRON", "RETICCTPCT" in the last 72 hours. Urine analysis:    Component Value Date/Time   COLORURINE STRAW (A) 04/06/2022 0555   APPEARANCEUR CLEAR 04/06/2022 0555   LABSPEC 1.009 04/06/2022 0555   PHURINE 5.0 04/06/2022 0555   GLUCOSEU NEGATIVE 04/06/2022 0555   HGBUR SMALL (A) 04/06/2022 0555   BILIRUBINUR NEGATIVE 04/06/2022 0555   KETONESUR NEGATIVE 04/06/2022 0555   PROTEINUR NEGATIVE 04/06/2022 0555   NITRITE  NEGATIVE 04/06/2022 0555   LEUKOCYTESUR NEGATIVE 04/06/2022 0555   Sepsis Labs: Invalid input(s): "PROCALCITONIN", "LACTICIDVEN"   SIGNED:  Mercy Riding, MD  [facility] 04/19/2022, 4:13 PM

## 2022-04-19 NOTE — Care Management Important Message (Signed)
Important Message  Patient Details  Name: Betty [name] MRN: [id number] Date of Birth: 1955-12-05   Medicare Important Message Given:  Yes     Memory Argue 04/19/2022, 1:47 PM

## 2022-04-20 LAB — ANA: Anti Nuclear Antibody (ANA): NEGATIVE

## 2022-04-20 LAB — THYROID PEROXIDASE ANTIBODY: Thyroperoxidase Ab SerPl-aCnc: <9 IU/mL (ref 0–34)

## 2022-04-21 LAB — ANTI-SMOOTH MUSCLE ANTIBODY, IGG: F-Actin IgG: 8 Units (ref 0–19)

## 2022-04-22 LAB — PROTEIN ELECTROPHORESIS, SERUM
A/G Ratio: 1 (ref 0.7–1.7)
Albumin ELP: 3.5 g/dL (ref 2.9–4.4)
Alpha-1-Globulin: 0.4 g/dL (ref 0.0–0.4)
Alpha-2-Globulin: 0.9 g/dL (ref 0.4–1.0)
Beta Globulin: 1 g/dL (ref 0.7–1.3)
Gamma Globulin: 1.2 g/dL (ref 0.4–1.8)
Globulin, Total: 3.5 g/dL (ref 2.2–3.9)
Total Protein ELP: 7 g/dL (ref 6.0–8.5)

## 2022-04-22 LAB — ANCA TITERS
Atypical P-ANCA titer: <1:20 {titer}
C-ANCA: <1:20 {titer}
P-ANCA: <1:20 {titer}

## 2022-05-14 ENCOUNTER — Other Ambulatory Visit: Payer: Self-pay | Admitting: Urology

## 2022-05-15 ENCOUNTER — Encounter (HOSPITAL_COMMUNITY): Payer: Self-pay

## 2022-05-15 NOTE — Progress Notes (Signed)
Sent message, via epic in basket, requesting orders in epic from surgeon.  

## 2022-05-15 NOTE — Patient Instructions (Signed)
SURGICAL WAITING ROOM VISITATION Patients having surgery or a procedure may have no more than 2 support people in the waiting area - these visitors may rotate.   Children under the age of 30 must have an adult with them who is not the patient. If the patient needs to stay at the hospital during part of their recovery, the visitor guidelines for inpatient rooms apply. Pre-op nurse will coordinate an appropriate time for 1 support person to accompany patient in pre-op.  This support person may not rotate.    Please refer to the [facility] website for the visitor guidelines for Inpatients (after your surgery is over and you are in a regular room).       Your procedure is scheduled on: Friday, Sept 1, 2023   Report to [facility] Main Entrance    Report to admitting at 5:15 AM   Call this number if you have problems the morning of surgery [phone]   Do not eat food :After Midnight.   After Midnight you may have the following liquids until 4:30 AM DAY OF SURGERY  Water Non-Citrus Juices (without pulp, NO RED) Carbonated Beverages Black Coffee (NO MILK/CREAM OR CREAMERS, sugar ok)  Clear Tea (NO MILK/CREAM OR CREAMERS, sugar ok) regular and decaf                             Plain Jell-O (NO RED)                                           Fruit ices (not with fruit pulp, NO RED)                                     Popsicles (NO RED)                                                               Sports drinks like Gatorade (NO RED)             FOLLOW BOWEL PREP AND ANY ADDITIONAL PRE OP INSTRUCTIONS YOU RECEIVED FROM YOUR SURGEON'S OFFICE!!!     Oral Hygiene is also important to reduce your risk of infection.                                    Remember - BRUSH YOUR TEETH THE MORNING OF SURGERY WITH YOUR REGULAR TOOTHPASTE   Do NOT smoke after Midnight   Take these medicines the morning of surgery with A SIP OF WATER: Amlodipine, Metoprolol, Tylenol if needed, Hydroxyzine if  needed  Bring CPAP mask and tubing day of surgery.                              You may not have any metal on your body including hair pins, jewelry, and body piercing             Do not wear make-up, lotions, powders, perfumes/cologne,  or deodorant  Do not wear nail polish including gel and S&S, artificial/acrylic nails, or any other type of covering on natural nails including finger and toenails. If you have artificial nails, gel coating, etc. that needs to be removed by a nail salon please have this removed prior to surgery or surgery may need to be canceled/ delayed if the surgeon/ anesthesia feels like they are unable to be safely monitored.   Do not shave  48 hours prior to surgery.    Do not bring valuables to the hospital. [place].   Contacts, dentures or bridgework may not be worn into surgery.  DO NOT [place]. PHARMACY WILL DISPENSE MEDICATIONS LISTED ON YOUR MEDICATION LIST TO YOU DURING YOUR ADMISSION [place]!    Patients discharged on the day of surgery will not be allowed to drive home.  Someone NEEDS to stay with you for the first 24 hours after anesthesia.   Special Instructions: Bring a copy of your healthcare power of attorney and living will documents         the day of surgery if you haven't scanned them before.              Please read over the following fact sheets you were given: IF YOU HAVE QUESTIONS ABOUT YOUR PRE-OP INSTRUCTIONS PLEASE CALL [phone]     [facility] - Preparing for Surgery Before surgery, you can play an important role.  Because skin is not sterile, your skin needs to be as free of germs as possible.  You can reduce the number of germs on your skin by washing with CHG (chlorahexidine gluconate) soap before surgery.  CHG is an antiseptic cleaner which kills germs and bonds with the skin to continue killing germs even after washing. Please DO NOT use  if you have an allergy to CHG or antibacterial soaps.  If your skin becomes reddened/irritated stop using the CHG and inform your nurse when you arrive at Short Stay. Do not shave (including legs and underarms) for at least 48 hours prior to the first CHG shower.  You may shave your face/neck.  Please follow these instructions carefully:  1.  Shower with CHG Soap the night before surgery and the  morning of surgery.  2.  If you choose to wash your hair, wash your hair first as usual with your normal  shampoo.  3.  After you shampoo, rinse your hair and body thoroughly to remove the shampoo.                             4.  Use CHG as you would any other liquid soap.  You can apply chg directly to the skin and wash.  Gently with a scrungie or clean washcloth.  5.  Apply the CHG Soap to your body ONLY FROM THE NECK DOWN.   Do   not use on face/ open                           Wound or open sores. Avoid contact with eyes, ears mouth and   genitals (private parts).                       Wash face,  Development worker, international aid (private  parts) with your normal soap.             6.  Wash thoroughly, paying special attention to the area where your    surgery  will be performed.  7.  Thoroughly rinse your body with warm water from the neck down.  8.  DO NOT shower/wash with your normal soap after using and rinsing off the CHG Soap.                9.  Pat yourself dry with a clean towel.            10.  Wear clean pajamas.            11.  Place clean sheets on your bed the night of your first shower and do not  sleep with pets. Day of Surgery : Do not apply any lotions/deodorants the morning of surgery.  Please wear clean clothes to the hospital/surgery center.  FAILURE TO FOLLOW THESE INSTRUCTIONS MAY RESULT IN THE CANCELLATION OF YOUR SURGERY  PATIENT SIGNATURE_________________________________  NURSE SIGNATURE__________________________________  ________________________________________________________________________

## 2022-05-16 ENCOUNTER — Other Ambulatory Visit: Payer: Self-pay

## 2022-05-16 ENCOUNTER — Encounter (HOSPITAL_COMMUNITY): Payer: Self-pay

## 2022-05-16 ENCOUNTER — Encounter (HOSPITAL_COMMUNITY)
Admission: RE | Admit: 2022-05-16 | Discharge: 2022-05-16 | Disposition: A | Discharge status: Home / Self Care | Payer: Medicare Other | Source: Ambulatory Visit | Attending: Urology | Admitting: Urology

## 2022-05-16 VITALS — BP 125/84 | HR 69 | Temp 98.5°F | Ht 66.0 in | Wt 154.9 lb

## 2022-05-16 DIAGNOSIS — E876 Hypokalemia: Secondary | ICD-10-CM | POA: Insufficient documentation

## 2022-05-16 DIAGNOSIS — Z8679 Personal history of other diseases of the circulatory system: Secondary | ICD-10-CM | POA: Insufficient documentation

## 2022-05-16 DIAGNOSIS — Z01818 Encounter for other preprocedural examination: Secondary | ICD-10-CM | POA: Insufficient documentation

## 2022-05-16 DIAGNOSIS — F1010 Alcohol abuse, uncomplicated: Secondary | ICD-10-CM | POA: Insufficient documentation

## 2022-05-16 HISTORY — DX: Chronic kidney disease, unspecified: N18.9

## 2022-05-16 HISTORY — DX: Depression, unspecified: F32.A

## 2022-05-16 HISTORY — DX: Anxiety disorder, unspecified: F41.9

## 2022-05-16 LAB — COMPREHENSIVE METABOLIC PANEL
ALT: 9 U/L (ref 0–44)
AST: 14 U/L — ABNORMAL LOW (ref 15–41)
Albumin: 4.3 g/dL (ref 3.5–5.0)
Alkaline Phosphatase: 90 U/L (ref 38–126)
Anion gap: 13 (ref 5–15)
BUN: 6 mg/dL — ABNORMAL LOW (ref 8–23)
CO2: 20 mmol/L — ABNORMAL LOW (ref 22–32)
Calcium: 9.2 mg/dL (ref 8.9–10.3)
Chloride: 105 mmol/L (ref 98–111)
Creatinine, Ser: 0.56 mg/dL (ref 0.44–1.00)
GFR, Estimated: >60 mL/min (ref >60)
Glucose, Bld: 94 mg/dL (ref 70–99)
Potassium: 3.3 mmol/L — ABNORMAL LOW (ref 3.5–5.1)
Sodium: 138 mmol/L (ref 135–145)
Total Bilirubin: 0.7 mg/dL (ref 0.3–1.2)
Total Protein: 8.1 g/dL (ref 6.5–8.1)

## 2022-05-16 LAB — CBC
HCT: 39.4 % (ref 36.0–46.0)
Hemoglobin: 13.4 g/dL (ref 12.0–15.0)
MCH: 33.2 pg (ref 26.0–34.0)
MCHC: 34 g/dL (ref 30.0–36.0)
MCV: 97.5 fL (ref 80.0–100.0)
Platelets: 514 10*3/uL — ABNORMAL HIGH (ref 150–400)
RBC: 4.04 MIL/uL (ref 3.87–5.11)
RDW: 14.2 % (ref 11.5–15.5)
WBC: 11.1 10*3/uL — ABNORMAL HIGH (ref 4.0–10.5)
nRBC: 0 % (ref 0.0–0.2)

## 2022-05-16 NOTE — Progress Notes (Signed)
For Short Stay: [place] appointment date: Date of COVID positive in last 12 days:  Bowel Prep reminder:   For Anesthesia: PCP - Dr. Garwin Brothers Cardiologist -   Chest x-ray -  EKG -  Stress Test -  ECHO -  Cardiac Cath -  Pacemaker/ICD device last checked: Pacemaker orders received: Device Rep notified:  Spinal Cord Stimulator:  Sleep Study -  CPAP -   Fasting Blood Sugar -  Checks Blood Sugar _____ times a day Date and result of last Hgb A1c-  Blood Thinner Instructions: Aspirin Instructions: Last Dose:  Activity level: Can go up a flight of stairs and activities of daily living without stopping and without chest pain and/or shortness of breath   Able to exercise without chest pain and/or shortness of breath   Unable to go up a flight of stairs without chest pain and/or shortness of breath     Anesthesia review: Hx: Smoker,HTN.  Patient denies shortness of breath, fever, cough and chest pain at PAT appointment   Patient verbalized understanding of instructions that were given to them at the PAT appointment. Patient was also instructed that they will need to review over the PAT instructions again at home before surgery.

## 2022-05-16 NOTE — Patient Instructions (Signed)
DUE TO COVID-19 ONLY TWO VISITORS  (aged 65 and older)  ARE ALLOWED TO COME WITH YOU AND STAY IN THE WAITING ROOM ONLY DURING PRE OP AND PROCEDURE.   **NO VISITORS ARE ALLOWED IN THE SHORT STAY AREA OR RECOVERY ROOM!!**  IF YOU WILL BE ADMITTED INTO THE HOSPITAL YOU ARE ALLOWED ONLY FOUR SUPPORT PEOPLE DURING VISITATION HOURS ONLY (7 AM -8PM)   The support person(s) must pass our screening, gel in and out, and wear a mask at all times, including in the patient's room. Patients must also wear a mask when staff or their support person are in the room. Visitors GUEST BADGE MUST BE WORN VISIBLY  One adult visitor may remain with you overnight and MUST be in the room by 8 P.M.     Your procedure is scheduled on: 05/17/22   Report to [facility] Main Entrance    Report to admitting at : 5:15 AM   Call this number if you have problems the morning of surgery [phone]   Do not eat food :After Midnight.    Oral Hygiene is also important to reduce your risk of infection.                                    Remember - BRUSH YOUR TEETH THE MORNING OF SURGERY WITH YOUR REGULAR TOOTHPASTE   Do NOT smoke after Midnight   Take these medicines the morning of surgery with A SIP OF WATER: amlodipine,metoprolol.Tylenol,hydroxyzine if needed.  DO NOT TAKE ANY ORAL DIABETIC MEDICATIONS DAY OF YOUR SURGERY  Bring CPAP mask and tubing day of surgery.                              You may not have any metal on your body including hair pins, jewelry, and body piercing             Do not wear make-up, lotions, powders, perfumes/cologne, or deodorant  Do not wear nail polish including gel and S&S, artificial/acrylic nails, or any other type of covering on natural nails including finger and toenails. If you have artificial nails, gel coating, etc. that needs to be removed by a nail salon please have this removed prior to surgery or surgery may need to be canceled/ delayed if the surgeon/ anesthesia  feels like they are unable to be safely monitored.   Do not shave  48 hours prior to surgery.   Do not bring valuables to the hospital. [place].   Contacts, dentures or bridgework may not be worn into surgery.   Bring small overnight bag day of surgery.   DO NOT [place]. PHARMACY WILL DISPENSE MEDICATIONS LISTED ON YOUR MEDICATION LIST TO YOU DURING YOUR ADMISSION [place]!    Patients discharged on the day of surgery will not be allowed to drive home.  Someone NEEDS to stay with you for the first 24 hours after anesthesia.   Special Instructions: Bring a copy of your healthcare power of attorney and living will documents         the day of surgery if you haven't scanned them before.              Please read over  the following fact sheets you were given: IF YOU HAVE QUESTIONS ABOUT YOUR PRE-OP INSTRUCTIONS PLEASE CALL [phone]     [facility] - Preparing for Surgery Before surgery, you can play an important role.  Because skin is not sterile, your skin needs to be as free of germs as possible.  You can reduce the number of germs on your skin by washing with CHG (chlorahexidine gluconate) soap before surgery.  CHG is an antiseptic cleaner which kills germs and bonds with the skin to continue killing germs even after washing. Please DO NOT use if you have an allergy to CHG or antibacterial soaps.  If your skin becomes reddened/irritated stop using the CHG and inform your nurse when you arrive at Short Stay. Do not shave (including legs and underarms) for at least 48 hours prior to the first CHG shower.  You may shave your face/neck. Please follow these instructions carefully:  1.  Shower with CHG Soap the night before surgery and the  morning of Surgery.  2.  If you choose to wash your hair, wash your hair first as usual with your  normal  shampoo.  3.  After you shampoo, rinse your hair and  body thoroughly to remove the  shampoo.                           4.  Use CHG as you would any other liquid soap.  You can apply chg directly  to the skin and wash                       Gently with a scrungie or clean washcloth.  5.  Apply the CHG Soap to your body ONLY FROM THE NECK DOWN.   Do not use on face/ open                           Wound or open sores. Avoid contact with eyes, ears mouth and genitals (private parts).                       Wash face,  Genitals (private parts) with your normal soap.             6.  Wash thoroughly, paying special attention to the area where your surgery  will be performed.  7.  Thoroughly rinse your body with warm water from the neck down.  8.  DO NOT shower/wash with your normal soap after using and rinsing off  the CHG Soap.                9.  Pat yourself dry with a clean towel.            10.  Wear clean pajamas.            11.  Place clean sheets on your bed the night of your first shower and do not  sleep with pets. Day of Surgery : Do not apply any lotions/deodorants the morning of surgery.  Please wear clean clothes to the hospital/surgery center.  FAILURE TO FOLLOW THESE INSTRUCTIONS MAY RESULT IN THE CANCELLATION OF YOUR SURGERY PATIENT SIGNATURE_________________________________  NURSE SIGNATURE__________________________________  ________________________________________________________________________

## 2022-05-17 ENCOUNTER — Ambulatory Visit (HOSPITAL_COMMUNITY)
Admission: RE | Admit: 2022-05-17 | Discharge: 2022-05-17 | Disposition: A | Discharge status: Home / Self Care | Payer: Medicare Other | Attending: Urology | Admitting: Urology

## 2022-05-17 ENCOUNTER — Encounter (HOSPITAL_COMMUNITY)
Admission: RE | Disposition: A | Discharge status: Home / Self Care | Payer: Self-pay | Source: Home / Self Care | Attending: Urology

## 2022-05-17 ENCOUNTER — Ambulatory Visit (HOSPITAL_BASED_OUTPATIENT_CLINIC_OR_DEPARTMENT_OTHER): Payer: Medicare Other | Admitting: Certified Registered Nurse Anesthetist

## 2022-05-17 ENCOUNTER — Encounter (HOSPITAL_COMMUNITY): Payer: Self-pay | Admitting: Urology

## 2022-05-17 ENCOUNTER — Ambulatory Visit (HOSPITAL_COMMUNITY): Payer: Medicare Other

## 2022-05-17 ENCOUNTER — Ambulatory Visit (HOSPITAL_COMMUNITY): Payer: Medicare Other | Admitting: Certified Registered Nurse Anesthetist

## 2022-05-17 DIAGNOSIS — Z936 Other artificial openings of urinary tract status: Secondary | ICD-10-CM | POA: Insufficient documentation

## 2022-05-17 DIAGNOSIS — F1010 Alcohol abuse, uncomplicated: Secondary | ICD-10-CM

## 2022-05-17 DIAGNOSIS — Z9049 Acquired absence of other specified parts of digestive tract: Secondary | ICD-10-CM | POA: Insufficient documentation

## 2022-05-17 DIAGNOSIS — N1339 Other hydronephrosis: Secondary | ICD-10-CM | POA: Insufficient documentation

## 2022-05-17 DIAGNOSIS — F17210 Nicotine dependence, cigarettes, uncomplicated: Secondary | ICD-10-CM | POA: Insufficient documentation

## 2022-05-17 DIAGNOSIS — C670 Malignant neoplasm of trigone of bladder: Secondary | ICD-10-CM | POA: Insufficient documentation

## 2022-05-17 DIAGNOSIS — F419 Anxiety disorder, unspecified: Secondary | ICD-10-CM | POA: Diagnosis not present

## 2022-05-17 DIAGNOSIS — N3289 Other specified disorders of bladder: Secondary | ICD-10-CM

## 2022-05-17 DIAGNOSIS — I10 Essential (primary) hypertension: Secondary | ICD-10-CM | POA: Insufficient documentation

## 2022-05-17 DIAGNOSIS — N1330 Unspecified hydronephrosis: Secondary | ICD-10-CM

## 2022-05-17 DIAGNOSIS — Z8679 Personal history of other diseases of the circulatory system: Secondary | ICD-10-CM

## 2022-05-17 DIAGNOSIS — E876 Hypokalemia: Secondary | ICD-10-CM

## 2022-05-17 HISTORY — PX: CYSTOSCOPY/RETROGRADE/URETEROSCOPY: SHX5316

## 2022-05-17 HISTORY — PX: TRANSURETHRAL RESECTION OF BLADDER TUMOR: SHX2575

## 2022-05-17 SURGERY — CYSTOSCOPY/RETROGRADE/URETEROSCOPY
Anesthesia: General | Site: Ureter

## 2022-05-17 MED ORDER — LIDOCAINE 2% (20 MG/ML) 5 ML SYRINGE
INTRAMUSCULAR | Status: DC | PRN
  Administered 2022-05-17: 60 mg via INTRAVENOUS

## 2022-05-17 MED ORDER — DEXAMETHASONE SODIUM PHOSPHATE 4 MG/ML IJ SOLN
INTRAMUSCULAR | Status: DC | PRN
  Administered 2022-05-17: 5 mg via INTRAVENOUS

## 2022-05-17 MED ORDER — OXYCODONE-ACETAMINOPHEN 5-325 MG PO TABS: 1.0000 | ORAL_TABLET | Freq: Four times a day (QID) | ORAL | 0 refills | Status: AC | PRN

## 2022-05-17 MED ORDER — GENTAMICIN SULFATE 40 MG/ML IJ SOLN
5.0000 mg/kg | Freq: Once | INTRAVENOUS | Status: AC
  Administered 2022-05-17: 350 mg via INTRAVENOUS
  Filled 2022-05-17: qty 8.75

## 2022-05-17 MED ORDER — MIDAZOLAM HCL 2 MG/2ML IJ SOLN
INTRAMUSCULAR | Status: AC
  Filled 2022-05-17: qty 2

## 2022-05-17 MED ORDER — ONDANSETRON HCL 4 MG/2ML IJ SOLN: 4.0000 mg | Freq: Once | INTRAMUSCULAR | Status: DC | PRN

## 2022-05-17 MED ORDER — FENTANYL CITRATE PF 50 MCG/ML IJ SOSY
25.0000 ug | PREFILLED_SYRINGE | INTRAMUSCULAR | Status: DC | PRN
  Administered 2022-05-17: 50 ug via INTRAVENOUS
  Administered 2022-05-17 (×2): 25 ug via INTRAVENOUS

## 2022-05-17 MED ORDER — ACETAMINOPHEN 10 MG/ML IV SOLN: 1000.0000 mg | Freq: Once | INTRAVENOUS | Status: DC | PRN

## 2022-05-17 MED ORDER — LIDOCAINE HCL (PF) 2 % IJ SOLN
INTRAMUSCULAR | Status: AC
  Filled 2022-05-17: qty 5

## 2022-05-17 MED ORDER — PHENYLEPHRINE 80 MCG/ML (10ML) SYRINGE FOR IV PUSH (FOR BLOOD PRESSURE SUPPORT)
PREFILLED_SYRINGE | INTRAVENOUS | Status: DC | PRN
  Administered 2022-05-17: 80 ug via INTRAVENOUS
  Administered 2022-05-17 (×2): 160 ug via INTRAVENOUS

## 2022-05-17 MED ORDER — FENTANYL CITRATE (PF) 100 MCG/2ML IJ SOLN
INTRAMUSCULAR | Status: AC
  Filled 2022-05-17: qty 2

## 2022-05-17 MED ORDER — MIDAZOLAM HCL 5 MG/5ML IJ SOLN
INTRAMUSCULAR | Status: DC | PRN
  Administered 2022-05-17: 2 mg via INTRAVENOUS

## 2022-05-17 MED ORDER — SODIUM CHLORIDE 0.9 % IR SOLN
Status: DC | PRN
  Administered 2022-05-17: 9000 mL via INTRAVESICAL

## 2022-05-17 MED ORDER — PROPOFOL 10 MG/ML IV BOLUS
INTRAVENOUS | Status: AC
  Filled 2022-05-17: qty 20

## 2022-05-17 MED ORDER — ONDANSETRON HCL 4 MG/2ML IJ SOLN
INTRAMUSCULAR | Status: AC
  Filled 2022-05-17: qty 2

## 2022-05-17 MED ORDER — ONDANSETRON HCL 4 MG/2ML IJ SOLN
INTRAMUSCULAR | Status: DC | PRN
  Administered 2022-05-17: 4 mg via INTRAVENOUS

## 2022-05-17 MED ORDER — FENTANYL CITRATE PF 50 MCG/ML IJ SOSY
PREFILLED_SYRINGE | INTRAMUSCULAR | Status: AC
  Filled 2022-05-17: qty 1

## 2022-05-17 MED ORDER — EPHEDRINE SULFATE-NACL 50-0.9 MG/10ML-% IV SOSY
PREFILLED_SYRINGE | INTRAVENOUS | Status: DC | PRN
  Administered 2022-05-17: 7.5 mg via INTRAVENOUS
  Administered 2022-05-17 (×2): 5 mg via INTRAVENOUS

## 2022-05-17 MED ORDER — CHLORHEXIDINE GLUCONATE 0.12 % MT SOLN
15.0000 mL | Freq: Once | OROMUCOSAL | Status: AC
  Administered 2022-05-17: 15 mL via OROMUCOSAL

## 2022-05-17 MED ORDER — KETOROLAC TROMETHAMINE 15 MG/ML IJ SOLN: 15.0000 mg | Freq: Once | INTRAMUSCULAR | Status: DC | PRN

## 2022-05-17 MED ORDER — DEXAMETHASONE SODIUM PHOSPHATE 10 MG/ML IJ SOLN
INTRAMUSCULAR | Status: AC
  Filled 2022-05-17: qty 1

## 2022-05-17 MED ORDER — FENTANYL CITRATE (PF) 100 MCG/2ML IJ SOLN
INTRAMUSCULAR | Status: DC | PRN
Start: 2022-05-17 — End: 2022-05-17
  Administered 2022-05-17: 50 ug via INTRAVENOUS
  Administered 2022-05-17: 25 ug via INTRAVENOUS
  Administered 2022-05-17 (×2): 50 ug via INTRAVENOUS
  Administered 2022-05-17: 25 ug via INTRAVENOUS

## 2022-05-17 MED ORDER — EPHEDRINE 5 MG/ML INJ
INTRAVENOUS | Status: AC
Start: 2022-05-17 — End: ?
  Filled 2022-05-17: qty 5

## 2022-05-17 MED ORDER — IOHEXOL 300 MG/ML  SOLN
INTRAMUSCULAR | Status: DC | PRN
  Administered 2022-05-17: 20 mL via URETHRAL

## 2022-05-17 MED ORDER — AMISULPRIDE (ANTIEMETIC) 5 MG/2ML IV SOLN: 10.0000 mg | Freq: Once | INTRAVENOUS | Status: DC | PRN

## 2022-05-17 MED ORDER — ORAL CARE MOUTH RINSE: 15.0000 mL | Freq: Once | OROMUCOSAL | Status: AC

## 2022-05-17 MED ORDER — PHENYLEPHRINE 80 MCG/ML (10ML) SYRINGE FOR IV PUSH (FOR BLOOD PRESSURE SUPPORT)
PREFILLED_SYRINGE | INTRAVENOUS | Status: AC
  Filled 2022-05-17: qty 10

## 2022-05-17 MED ORDER — PROPOFOL 10 MG/ML IV BOLUS
INTRAVENOUS | Status: DC | PRN
  Administered 2022-05-17: 50 mg via INTRAVENOUS
  Administered 2022-05-17: 150 mg via INTRAVENOUS

## 2022-05-17 MED ORDER — 0.9 % SODIUM CHLORIDE (POUR BTL) OPTIME
TOPICAL | Status: DC | PRN
  Administered 2022-05-17: 1000 mL

## 2022-05-17 MED ORDER — LACTATED RINGERS IV SOLN
INTRAVENOUS | Status: DC
Start: 2022-05-17 — End: 2022-05-17

## 2022-05-17 SURGICAL SUPPLY — 37 items
BAG DRN RND TRDRP ANRFLXCHMBR (UROLOGICAL SUPPLIES)
BAG URINE DRAIN 2000ML AR STRL (UROLOGICAL SUPPLIES) IMPLANT
BAG URO CATCHER STRL LF (MISCELLANEOUS) ×3 IMPLANT
BASKET LASER NITINOL 1.9FR (BASKET) IMPLANT
BSKT STON RTRVL 120 1.9FR (BASKET)
CATH URETL OPEN END 6FR 70 (CATHETERS) ×3 IMPLANT
CATH UROLOGY TORQUE 40 (MISCELLANEOUS) IMPLANT
CLOTH BEACON ORANGE TIMEOUT ST (SAFETY) ×3 IMPLANT
DRAPE FOOT SWITCH (DRAPES) ×3 IMPLANT
ELECT REM PT RETURN 15FT ADLT (MISCELLANEOUS) ×3 IMPLANT
EVACUATOR MICROVAS BLADDER (UROLOGICAL SUPPLIES) IMPLANT
EXTRACTOR STONE 1.7FRX115CM (UROLOGICAL SUPPLIES) IMPLANT
GLOVE SURG LX 7.5 STRW (GLOVE) ×2
GLOVE SURG LX STRL 7.5 STRW (GLOVE) ×3 IMPLANT
GOWN SRG XL LVL 4 BRTHBL STRL (GOWNS) ×3 IMPLANT
GOWN STRL NON-REIN XL LVL4 (GOWNS) ×2
GUIDEWIRE ANG ZIPWIRE 038X150 (WIRE) ×3 IMPLANT
GUIDEWIRE STR DUAL SENSOR (WIRE) ×3 IMPLANT
IV NS IRRIG 3000ML ARTHROMATIC (IV SOLUTION) IMPLANT
KIT TURNOVER KIT A (KITS) IMPLANT
LASER FIB FLEXIVA PULSE ID 365 (Laser) IMPLANT
LOOP CUT BIPOLAR 24F LRG (ELECTROSURGICAL) IMPLANT
MANIFOLD NEPTUNE II (INSTRUMENTS) ×3 IMPLANT
NS IRRIG 1000ML POUR BTL (IV SOLUTION) IMPLANT
PACK CYSTO (CUSTOM PROCEDURE TRAY) ×3 IMPLANT
POLARIS 6X22 LAWSON 63772 IMPLANT
SHEATH NAVIGATOR HD 11/13X28 (SHEATH) IMPLANT
SHEATH NAVIGATOR HD 11/13X36 (SHEATH) IMPLANT
STENT URET 6FRX22 CONTOUR (STENTS) IMPLANT
STOPCOCK 4 WAY LG BORE MALE ST (IV SETS) IMPLANT
SYR TOOMEY IRRIG 70ML (MISCELLANEOUS)
SYRINGE TOOMEY IRRIG 70ML (MISCELLANEOUS) IMPLANT
TRACTIP FLEXIVA PULS ID 200XHI (Laser) IMPLANT
TRACTIP FLEXIVA PULSE ID 200 (Laser)
TUBE FEEDING 8FR 16IN STR KANG (MISCELLANEOUS) ×3 IMPLANT
TUBING CONNECTING 10 (TUBING) ×3 IMPLANT
TUBING UROLOGY SET (TUBING) ×3 IMPLANT

## 2022-05-17 NOTE — Transfer of Care (Signed)
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  Last Pain:  Vitals:   05/17/22 0623  TempSrc:   PainSc: 7       Patients Stated Pain Goal: 6 (85/88/50 2774)  Complications: No notable events documented.

## 2022-05-17 NOTE — Anesthesia Preprocedure Evaluation (Addendum)
Anesthesia Evaluation  Patient identified by MRN, date of birth, ID band Patient awake    Reviewed: Allergy & Precautions, NPO status , Patient's Chart, lab work & pertinent test results  Airway Mallampati: II  TM Distance: >3 FB Neck ROM: Full    Dental no notable dental hx.    Pulmonary Current Smoker and Patient abstained from smoking.,    Pulmonary exam normal        Cardiovascular hypertension, Pt. on medications and Pt. on home beta blockers Normal cardiovascular exam     Neuro/Psych PSYCHIATRIC DISORDERS Anxiety Depression negative neurological ROS     GI/Hepatic negative GI ROS, (+)     substance abuse  ,   Endo/Other  negative endocrine ROS  Renal/GU Renal disease     Musculoskeletal negative musculoskeletal ROS (+)   Abdominal   Peds  Hematology negative hematology ROS (+)   Anesthesia Other Findings HYDRONEPHROSIS, BLADDER MASS  Reproductive/Obstetrics                            Anesthesia Physical Anesthesia Plan  ASA: 2  Anesthesia Plan: General   Post-op Pain Management:    Induction: Intravenous  PONV Risk Score and Plan: 2 and Ondansetron, Dexamethasone, Midazolam and Treatment may vary due to age or medical condition  Airway Management Planned: Oral ETT  Additional Equipment:   Intra-op Plan:   Post-operative Plan: Extubation in OR  Informed Consent: I have reviewed the patients History and Physical, chart, labs and discussed the procedure including the risks, benefits and alternatives for the proposed anesthesia with the patient or authorized representative who has indicated his/her understanding and acceptance.     Dental advisory given  Plan Discussed with: CRNA  Anesthesia Plan Comments:        Anesthesia Quick Evaluation

## 2022-05-17 NOTE — H&P (Signed)
Betty [name] is an 66 y.o. female.    Chief Complaint: Pre-OP Cysto, Retrogrades, Ureteroscopy/Biopsy, Possible Bladder Biopsy  HPI:   1 - R>L Hydronephrosis, Rule Out Ureteral or Bladder Cancer- bilateral neph tubes placed 03/2022 during hospitalization for acute renal failure and possible pyelo. UCX negative. Ct with R>l hydro and qeustionable urothelial thickenig of bladder and R>L ureter. Cytology equivocal. Cr 2's now <1. 50PY smoker, still smokes.   PMH sig for appy, HTN. No CV disesae / blood thinners. 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 Her PCP is Dr. Reesa [name] with [name].   Today " [name] " (pronounced Veil-ya) is seen to proceed with endoscopic exam to rule out urothelial malignancy. No interaval fevers. Neph tubes draiing well. 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   Past Medical History:  Diagnosis Date   Anxiety    Chronic kidney disease    Depression    GERD (gastroesophageal reflux disease)    HTN (hypertension)     Past Surgical History:  Procedure Laterality Date   APPENDECTOMY     IR NEPHROSTOMY PLACEMENT LEFT  04/15/2022   IR NEPHROSTOMY PLACEMENT RIGHT  04/12/2022   IR RADIOLOGY PERIPHERAL GUIDED IV START  04/15/2022   IR US GUIDE VASC ACCESS RIGHT  04/15/2022    History reviewed. No pertinent family history. Social History:  reports that she has been smoking cigarettes. She has a 50.00 pack-year smoking history. She does not have any smokeless tobacco history on file. She reports current alcohol use. She reports that she does not currently use drugs.  Allergies: No Known Allergies  Medications Prior to Admission  Medication Sig Dispense Refill   acetaminophen (TYLENOL) 500 MG tablet Take 500 mg by mouth every 4 (four) hours as needed (pain).     amLODipine (NORVASC) 10 MG tablet Take 1 tablet (10 mg total) by mouth daily. 30 tablet 1   hydrOXYzine (ATARAX) 25 MG tablet Take 1  tablet (25 mg total) by mouth 3 (three) times daily as needed for anxiety. 90 tablet 0   metoprolol tartrate (LOPRESSOR) 25 MG tablet Take 1 tablet (25 mg total) by mouth 2 (two) times daily. 60 tablet 1   polyethylene glycol powder (MIRALAX) 17 GM/SCOOP powder Take 17 g by mouth 2 (two) times daily as needed for mild constipation. 255 g 1   nicotine (NICODERM CQ - DOSED IN MG/24 HOURS) 21 mg/24hr patch Place 1 patch (21 mg total) onto the skin daily. (Patient not taking: Reported on 05/15/2022) 28 patch 0   senna-docusate (SENOKOT-S) 8.6-50 MG tablet Take 1 tablet by mouth 2 (two) times daily between meals as needed for moderate constipation. (Patient not taking: Reported on 05/15/2022)  0   sodium chloride flush (NS) 0.9 % SOLN Use 5 mLs by intracatheter route every 8 hours. Single use syringes 1800 mL 1   thiamine (VITAMIN B1) 100 MG tablet Take 1 tablet (100 mg total) by mouth daily. (Patient not taking: Reported on 05/15/2022) 30 tablet 1    Results for orders placed or performed during the hospital encounter of 05/16/22 (from the past 48 hour(s))  Comprehensive metabolic panel per protocol     Status: Abnormal   Collection Time: 05/16/22 11:30 AM  Result Value Ref Range   Sodium 138 135 - 145 mmol/L   Potassium 3.3 (L) 3.5 - 5.1 mmol/L   Chloride 105 98 - 111 mmol/L   CO2 20 (L) 22 - 32 mmol/L  Glucose, Bld 94 70 - 99 mg/dL    Comment: Glucose reference range applies only to samples taken after fasting for at least 8 hours.   BUN 6 (L) 8 - 23 mg/dL   Creatinine, Ser 0.56 0.44 - 1.00 mg/dL   Calcium 9.2 8.9 - 10.3 mg/dL   Total Protein 8.1 6.5 - 8.1 g/dL   Albumin 4.3 3.5 - 5.0 g/dL   AST 14 (L) 15 - 41 U/L   ALT 9 0 - 44 U/L   Alkaline Phosphatase 90 38 - 126 U/L   Total Bilirubin 0.7 0.3 - 1.2 mg/dL   GFR, Estimated >60 >60 mL/min    Comment: (NOTE) Calculated using the CKD-EPI Creatinine Equation (2021)    Anion gap 13 5 - 15    Comment: Performed at [facility], [place] [address], [place], [place] [address]  CBC per protocol     Status: Abnormal   Collection Time: 05/16/22 11:30 AM  Result Value Ref Range   WBC 11.1 (H) 4.0 - 10.5 K/uL   RBC 4.04 3.87 - 5.11 MIL/uL   Hemoglobin 13.4 12.0 - 15.0 g/dL   HCT 39.4 36.0 - 46.0 %   MCV 97.5 80.0 - 100.0 fL   MCH 33.2 26.0 - 34.0 pg   MCHC 34.0 30.0 - 36.0 g/dL   RDW 14.2 11.5 - 15.5 %   Platelets 514 (H) 150 - 400 K/uL   nRBC 0.0 0.0 - 0.2 %    Comment: Performed at [facility], [place] [address], [place],  [address]   No results found.  Review of Systems  Constitutional:  Negative for chills and fever.  Genitourinary:  Positive for hematuria.  All other systems reviewed and are negative.   Height '5\' 6"'$  (1.676 m), weight 70.3 kg. Physical Exam Vitals reviewed.  Constitutional:      Comments: Very thin habitus, at baselne  HENT:     Head: Normocephalic.     Mouth/Throat:     Mouth: Mucous membranes are moist.  Eyes:     Pupils: Pupils are equal, round, and reactive to light.  Cardiovascular:     Rate and Rhythm: Normal rate.  Pulmonary:     Effort: Pulmonary effort is normal.  Abdominal:     General: Abdomen is flat.  Genitourinary:    Comments: No CVAT at present Musculoskeletal:        General: Normal range of motion.     Cervical back: Normal range of motion.  Skin:    General: Skin is warm.  Neurological:     General: No focal deficit present.     Mental Status: She is alert.  Psychiatric:        Mood and Affect: Mood normal.      Assessment/Plan  Proceed as planned with cysto, retrogrades, bilateral ureteroscopy, possible bladder biopsy. Risks, benefits, alternatives, expected peri-op course discussed previously and reiterated today.   Alexis Frock, MD 05/17/2022, 5:40 AM

## 2022-05-17 NOTE — Brief Op Note (Signed)
05/17/2022  8:39 AM  PATIENT:  Betty [name]  66 y.o. female  PRE-OPERATIVE DIAGNOSIS:  HYDRONEPHROSIS, BLADDER MASS  POST-OPERATIVE DIAGNOSIS:  HYDRONEPHROSIS, BLADDER MASS  PROCEDURE:  Procedure(s) with comments: CYSTOSCOPY/ BILATERAL RETROGRADE/BILATERAL DIAGNOSTIC URETEROSCOPY AND STENT PLACEMENT (Bilateral) - 75 MINS TRANSURETHRAL RESECTION OF BLADDER TUMOR (TURBT) (N/A)  SURGEON:  Surgeon(s) and Role:    * Alexis Frock, MD - Primary  PHYSICIAN ASSISTANT:   ASSISTANTS: none   ANESTHESIA:   general  EBL:  0 mL   BLOOD ADMINISTERED:none  DRAINS:  bilateral nephrostomy tubes (clamped)    LOCAL MEDICATIONS USED:  NONE  SPECIMEN:  Source of Specimen:  bladder tissue  DISPOSITION OF SPECIMEN:  PATHOLOGY  COUNTS:  YES  TOURNIQUET:  * No tourniquets in log *  DICTATION: .Other Dictation: Dictation Number [id number]  PLAN OF CARE: Discharge to home after PACU  PATIENT DISPOSITION:  PACU - hemodynamically stable.   Delay start of Pharmacological VTE agent (>24hrs) due to surgical blood loss or risk of bleeding: yes

## 2022-05-17 NOTE — Discharge Instructions (Signed)
1 - You may have urinary urgency (bladder spasms) and bloody urine on / off with stent in place. This is normal. ° °2 - Call MD or go to ER for fever >102, severe pain / nausea / vomiting not relieved by medications, or acute change in medical status ° °

## 2022-05-17 NOTE — Anesthesia Procedure Notes (Signed)
Procedure Name: LMA Insertion Date/Time: 05/17/2022 7:31 AM  Performed by: Lind Covert, CRNAPre-anesthesia Checklist: Patient identified, Emergency Drugs available, Suction available and Patient being monitored Patient Re-evaluated:Patient Re-evaluated prior to induction Oxygen Delivery Method: Circle system utilized Preoxygenation: Pre-oxygenation with 100% oxygen Ventilation: Mask ventilation without difficulty LMA Size: 4.0

## 2022-05-17 NOTE — Op Note (Unsigned)
NAME: [name], Riyanna A. MEDICAL RECORD NO: [id number] ACCOUNT NO: [id number] DATE OF BIRTH: 1956/01/03 FACILITY: [facility] LOCATION: [facility] PHYSICIAN: Alexis Frock, MD  Operative Report   DATE OF PROCEDURE: 05/17/2022  PREOPERATIVE DIAGNOSIS:  Bilateral hydronephrosis, rule out ureteral and bladder mass.  PROCEDURE PERFORMED: 1.  Cystoscopy with bilateral retrograde pyelograms interpretation. 2.  Bilateral diagnostic ureteroscopy. 3.  Insertion of bilateral ureteral stents. 4.  Transurethral resection of bladder tumor, volume small.  FINDINGS: 1.  No evidence of intraluminal papillary neoplasia within the bladder or bilateral ureters. 2.  No evidence of intraluminal obstruction tissue within the ureters bilaterally. 3.  Small capacity, scarified appearing bladder with some mass effect on ureteral orifices.  Differential includes Hunner's disease, atypical neoplasm or severe post-infectious inflammation.  INDICATIONS:  The patient is a 66 year old lady with very extensive smoking history who was found on workup of flank pain and acute renal failure to have right greater than left hydronephrosis and very unusual appearing soft tissue around the right  greater than left ureter and bladder, initially quite concerning for urothelial neoplasm versus severe inflammatory reaction versus atypical neoplasm such as lymphoma.  She had negative cultures.  She underwent bilateral nephrostomy tubes at this time.  Acute renal failure resolved.  She presents now for endoscopic evaluation to rule out intraluminal neoplasm.  Informed consent was obtained and placed in medical record.  PROCEDURE IN DETAIL:  The patient being verified, procedure being cystoscopy, bilateral retrograde pyelograms, transurethral resection of bladder tumor, possible ureteral biopsy was confirmed.  Procedure timeout was performed.  Intravenous antibiotics  were administered and general LMA anesthesia was induced.  The patient was  placed into a low lithotomy position.  Sterile field was created, prepped and draped the patient's vagina, introitus, and proximal thighs using iodine.  Her in situ nephrostomy  tubes were left to drain.  Cystourethroscopy was performed using 21-French rigid cystoscope with offset lens.  Inspection of the urinary bladder revealed a small capacity, somewhat scarified appearing bladder without any obvious papillary neoplasm,  whatsoever.  There was what appeared to be some mass effect in the area of the ureteral orifices and the ureteral orifices were not visibly patent.  Multiple attempts were made to cannulate them bilaterally using open-ended catheter with wire and even a  KMP angle-tipped catheter with wire.  This was concerning for possible malignant obstruction.  As such, the cystoscope was exchanged with a 26-French resectoscope sheath with visual obturator and very careful resection was performed of the abnormal mass  effect tissue in the area of the trigone, purposely resecting bilateral ureteral orifices down to what appeared to be a patent lumen.  Exquisite care was taken to avoid bladder perforation, this did not occur.  This bladder tissue was set aside for  permanent pathology. Having resected the trigone area, we were then able to successfully cannulate the right ureteral orifice and right retrograde pyelogram was obtained.  Right retrograde pyelogram demonstrated single right ureter, single system right kidney.  There were no filling defects or narrowing noted, whatsoever.  A ZIPwire was advanced to level of upper pole, set aside as a safety wire.  Next, left retrograde  pyelogram was obtained.  Left retrograde pyelogram demonstrated single left ureter, single system left kidney.  No filling defects or narrowing noted.  A separate ZIPwire was advanced to level of the upper pole, set aside as a safety wire.  An 8-French feeding tube placed in the  urinary bladder for pressure release.   Semirigid ureteroscopy performed  of distal half of the right ureter alongside a separate sensor working wire.  There was some inflammatory appearing debris within the ureter. However, there was no evidence of  intraluminal neoplasm or obstructing mass whatsoever.  This was quite favorable.  The semirigid scope was then exchanged over the sensor working wire with a single channel digital ureteroscope all the way to the kidney and panendoscopic examination was  performed of the right kidney, including all calices x2 and visualization of the entire ureter.  Again, there was some inflammatory tissue in the distal ureter. However, there was no evidence of intraluminal urothelial neoplasm whatsoever.  This was  quite favorable.  The in situ nephrostomy tube was visualized and appeared to be in good position.  Next, semirigid scope was used to visualize the distal most aspect of the left ureter. However, the left ureter was much narrow in caliber, appeared most  likely physiologic, not frank stricturing or mass effect and only the distal most left ureter was able to be inspected directly, but the left retrograde pyelogram was actually more reassuring than the right.  In an effort to increase her quality of life,  bilateral ureteral stents were placed, 6 x 22 Contour bilaterally.  Good proximal and distal planes were noted.  The nephrostomy tube was then capped and the procedure was terminated.  The patient tolerated the procedure well, no immediate  periprocedural complications.  The patient was taken to postanesthesia care unit in stable condition, with plan for discharge home.  We will discuss pathologic results in the office and come up with a plan based on this.  If all is benign, then likely  consider a path towards repeat diagnostic ureteroscopy with removal of nephrostomy tubes versus more extirpative path if malignant.   SHW D: 05/17/2022 8:46:56 am T: 05/17/2022 10:18:00 am  JOB: [id number]/ [id number]

## 2022-05-17 NOTE — Anesthesia Postprocedure Evaluation (Signed)
Anesthesia Post Note  Patient: [name]  Procedure(s) Performed: CYSTOSCOPY/ BILATERAL RETROGRADE/BILATERAL DIAGNOSTIC URETEROSCOPY AND STENT PLACEMENT (Bilateral: Ureter) TRANSURETHRAL RESECTION OF BLADDER TUMOR (TURBT) (Bladder)     Patient location during evaluation: PACU Anesthesia Type: General Level of consciousness: awake Pain management: pain level controlled Vital Signs Assessment: post-procedure vital signs reviewed and stable Respiratory status: spontaneous breathing, nonlabored ventilation, respiratory function stable and patient connected to nasal cannula oxygen Cardiovascular status: blood pressure returned to baseline and stable Postop Assessment: no apparent nausea or vomiting Anesthetic complications: no   No notable events documented.  Last Vitals:  Vitals:   05/17/22 0930 05/17/22 0945  BP: 116/82 (!) 129/92  Pulse: 79 78  Resp: 18 16  Temp: 36.6 C 36.6 C  SpO2: 95% 95%    Last Pain:  Vitals:   05/17/22 0945  TempSrc:   PainSc: 0-No pain                 Timmothy Baranowski P Keylin Ferryman

## 2022-05-18 ENCOUNTER — Encounter (HOSPITAL_COMMUNITY): Payer: Self-pay

## 2022-05-18 ENCOUNTER — Emergency Department (HOSPITAL_COMMUNITY): Payer: Medicare Other

## 2022-05-18 ENCOUNTER — Other Ambulatory Visit: Payer: Self-pay

## 2022-05-18 ENCOUNTER — Emergency Department (HOSPITAL_COMMUNITY)
Admission: EM | Admit: 2022-05-18 | Discharge: 2022-05-18 | Disposition: A | Discharge status: Home / Self Care | Payer: Medicare Other | Attending: Emergency Medicine | Admitting: Emergency Medicine

## 2022-05-18 DIAGNOSIS — N1339 Other hydronephrosis: Secondary | ICD-10-CM | POA: Diagnosis not present

## 2022-05-18 DIAGNOSIS — I129 Hypertensive chronic kidney disease with stage 1 through stage 4 chronic kidney disease, or unspecified chronic kidney disease: Secondary | ICD-10-CM | POA: Diagnosis not present

## 2022-05-18 DIAGNOSIS — Z79899 Other long term (current) drug therapy: Secondary | ICD-10-CM | POA: Diagnosis not present

## 2022-05-18 DIAGNOSIS — T83032A Leakage of nephrostomy catheter, initial encounter: Secondary | ICD-10-CM | POA: Insufficient documentation

## 2022-05-18 DIAGNOSIS — Y69 Unspecified misadventure during surgical and medical care: Secondary | ICD-10-CM | POA: Insufficient documentation

## 2022-05-18 DIAGNOSIS — N189 Chronic kidney disease, unspecified: Secondary | ICD-10-CM | POA: Diagnosis not present

## 2022-05-18 DIAGNOSIS — N1330 Unspecified hydronephrosis: Secondary | ICD-10-CM

## 2022-05-18 LAB — CBC WITH DIFFERENTIAL/PLATELET
Abs Immature Granulocytes: 0.04 10*3/uL (ref 0.00–0.07)
Basophils Absolute: 0 10*3/uL (ref 0.0–0.1)
Basophils Relative: 0 %
Eosinophils Absolute: 0 10*3/uL (ref 0.0–0.5)
Eosinophils Relative: 0 %
HCT: 34.1 % — ABNORMAL LOW (ref 36.0–46.0)
Hemoglobin: 11.6 g/dL — ABNORMAL LOW (ref 12.0–15.0)
Immature Granulocytes: 0 %
Lymphocytes Relative: 14 %
Lymphs Abs: 1.8 10*3/uL (ref 0.7–4.0)
MCH: 33.2 pg (ref 26.0–34.0)
MCHC: 34 g/dL (ref 30.0–36.0)
MCV: 97.7 fL (ref 80.0–100.0)
Monocytes Absolute: 1.2 10*3/uL — ABNORMAL HIGH (ref 0.1–1.0)
Monocytes Relative: 10 %
Neutro Abs: 9.8 10*3/uL — ABNORMAL HIGH (ref 1.7–7.7)
Neutrophils Relative %: 76 %
Platelets: 457 10*3/uL — ABNORMAL HIGH (ref 150–400)
RBC: 3.49 MIL/uL — ABNORMAL LOW (ref 3.87–5.11)
RDW: 13.7 % (ref 11.5–15.5)
WBC: 13 10*3/uL — ABNORMAL HIGH (ref 4.0–10.5)
nRBC: 0 % (ref 0.0–0.2)

## 2022-05-18 LAB — BASIC METABOLIC PANEL
Anion gap: 8 (ref 5–15)
BUN: 11 mg/dL (ref 8–23)
CO2: 22 mmol/L (ref 22–32)
Calcium: 8.8 mg/dL — ABNORMAL LOW (ref 8.9–10.3)
Chloride: 102 mmol/L (ref 98–111)
Creatinine, Ser: 0.69 mg/dL (ref 0.44–1.00)
GFR, Estimated: >60 mL/min (ref >60)
Glucose, Bld: 91 mg/dL (ref 70–99)
Potassium: 4.4 mmol/L (ref 3.5–5.1)
Sodium: 132 mmol/L — ABNORMAL LOW (ref 135–145)

## 2022-05-18 MED ORDER — HYDROMORPHONE HCL 2 MG/ML IJ SOLN: 0.5000 mg | Freq: Once | INTRAMUSCULAR | Status: DC

## 2022-05-18 MED ORDER — OXYCODONE-ACETAMINOPHEN 5-325 MG PO TABS
1.0000 | ORAL_TABLET | Freq: Once | ORAL | Status: AC
  Administered 2022-05-18: 1 via ORAL
  Filled 2022-05-18: qty 1

## 2022-05-18 NOTE — Consult Note (Signed)
Urology Consult   Physician requesting consult: Charlesetta Shanks, MD  Reason for consult: Leakage around left nephrostomy tube  History of Present Illness: Betty [name] is a 66 y.o. with a history of right greater than left bilateral hydronephrosis who follows with Dr. Tresa Moore.  She was hospitalized in 03/2022 with acute renal failure and possible pyelonephritis and had bilateral nephrostomy tubes placed.  CT demonstrated right greater than left bilateral hydronephrosis and questionable urothelial thickening of the bladder and unusual appearing soft tissue around the right greater than left ureter and bladder.  Cytology was equivocal.  She was taken the operating room on 05/17/2022 by Dr. Tresa Moore for cystoscopy with bilateral retrograde pyelograms, bilateral diagnostic ureteroscopy, bilateral ureteral stents and TURBT.  Intraoperatively, there appeared to be mass effect in the area of the bilateral ureteral orifices and the orifices were not initially visibly patent.  They require resection in order to identify the ureters.  Patient states that after discharge home with bilateral nephrostomy tubes, she has not been voiding much urine.  She notes just dribbling with urination.  She had persistent urine leakage around her left-sided nephrostomy tube that soaked her bed sheets on multiple occasions overnight.  She presents to the emergency department for further evaluation.  She notes intermittent left-sided flank pain.  She denies fevers, chills, dysuria.  She denies gross hematuria.  KUB 05/18/2022 with stents and nephrostomy tubes in appropriate position.  Renal ultrasound with mild right and moderate left hydronephrosis.  Stents appear to be in appropriate position.  Past Medical History:  Diagnosis Date   Anxiety    Chronic kidney disease    Depression    GERD (gastroesophageal reflux disease)    HTN (hypertension)     Past Surgical History:  Procedure Laterality Date   APPENDECTOMY      CYSTOSCOPY/RETROGRADE/URETEROSCOPY Bilateral 05/17/2022   Procedure: CYSTOSCOPY/ BILATERAL RETROGRADE/BILATERAL DIAGNOSTIC URETEROSCOPY AND STENT PLACEMENT;  Surgeon: Alexis Frock, MD;  Location: [facility];  Service: Urology;  Laterality: Bilateral;  75 MINS   IR NEPHROSTOMY PLACEMENT LEFT  04/15/2022   IR NEPHROSTOMY PLACEMENT RIGHT  04/12/2022   IR RADIOLOGY PERIPHERAL GUIDED IV START  04/15/2022   IR US GUIDE VASC ACCESS RIGHT  04/15/2022   TRANSURETHRAL RESECTION OF BLADDER TUMOR N/A 05/17/2022   Procedure: TRANSURETHRAL RESECTION OF BLADDER TUMOR (TURBT);  Surgeon: Alexis Frock, MD;  Location: [facility];  Service: Urology;  Laterality: N/A;     Current Hospital Medications:  Home meds:  No current facility-administered medications on file prior to encounter.   Current Outpatient Medications on File Prior to Encounter  Medication Sig Dispense Refill   acetaminophen (TYLENOL) 500 MG tablet Take 500 mg by mouth every 4 (four) hours as needed (pain).     amLODipine (NORVASC) 10 MG tablet Take 1 tablet (10 mg total) by mouth daily. 30 tablet 1   hydrOXYzine (ATARAX) 25 MG tablet Take 1 tablet (25 mg total) by mouth 3 (three) times daily as needed for anxiety. 90 tablet 0   metoprolol tartrate (LOPRESSOR) 25 MG tablet Take 1 tablet (25 mg total) by mouth 2 (two) times daily. 60 tablet 1   oxyCODONE-acetaminophen (PERCOCET) 5-325 MG tablet Take 1 tablet by mouth every 6 (six) hours as needed for severe pain or moderate pain (post-operatively). 15 tablet 0   nicotine (NICODERM CQ - DOSED IN MG/24 HOURS) 21 mg/24hr patch Place 1 patch (21 mg total) onto the skin daily. (Patient not taking: Reported on 05/15/2022) 28 patch 0   polyethylene  glycol powder (MIRALAX) 17 GM/SCOOP powder Take 17 g by mouth 2 (two) times daily as needed for mild constipation. (Patient not taking: Reported on 05/18/2022) 255 g 1   senna-docusate (SENOKOT-S) 8.6-50 MG tablet Take 1 tablet by mouth 2 (two) times daily between  meals as needed for moderate constipation. (Patient not taking: Reported on 05/15/2022)  0   sodium chloride flush (NS) 0.9 % SOLN Use 5 mLs by intracatheter route every 8 hours. Single use syringes 1800 mL 1   sulfamethoxazole-trimethoprim (BACTRIM DS) 800-160 MG tablet Take 1 tablet by mouth 2 (two) times daily.     thiamine (VITAMIN B1) 100 MG tablet Take 1 tablet (100 mg total) by mouth daily. (Patient not taking: Reported on 05/15/2022) 30 tablet 1     Scheduled Meds:   HYDROmorphone (DILAUDID) injection  0.5 mg Intravenous Once   Continuous Infusions: PRN Meds:.  Allergies: No Known Allergies  History reviewed. No pertinent family history.  Social History:  reports that she has been smoking cigarettes. She has a 50.00 pack-year smoking history. She does not have any smokeless tobacco history on file. She reports current alcohol use. She reports that she does not currently use drugs.  ROS: A complete review of systems was performed.  All systems are negative except for pertinent findings as noted.  Physical Exam:  Vital signs in last 24 hours: Temp:  [97.9 F (36.6 C)-99.1 F (37.3 C)] 99.1 F (37.3 C) (09/02 0947) Pulse Rate:  [74-85] 74 (09/02 0947) Resp:  [14-16] 14 (09/02 0947) BP: (111-139)/(71-99) 120/71 (09/02 0947) SpO2:  [98 %-100 %] 98 % (09/02 0947) Constitutional:  Alert and oriented, No acute distress Cardiovascular: Regular rate and rhythm Respiratory: Normal respiratory effort, Lungs clear bilaterally GI: Abdomen is soft, nontender, nondistended, no abdominal masses GU: No CVA tenderness.  Initially, both nephrostomy tubes were capped.  There is significant persistent urine leakage around the left nephrostomy tube.  I uncapped the left nephrostomy tube in place this to gravity bag with initial brisk return of clear yellow urine.  The right nephrostomy tube was uncapped and there was mild right drainage.  This was then recapped. Neurologic: Grossly intact, no  focal deficits Psychiatric: Normal mood and affect  Laboratory Data:  Recent Labs    05/16/22 1130 05/18/22 0905  WBC 11.1* 13.0*  HGB 13.4 11.6*  HCT 39.4 34.1*  PLT 514* 457*    Recent Labs    05/16/22 1130 05/18/22 0905  NA 138 132*  K 3.3* 4.4  CL 105 102  GLUCOSE 94 91  BUN 6* 11  CALCIUM 9.2 8.8*  CREATININE 0.56 0.69     Results for orders placed or performed during the hospital encounter of 05/18/22 (from the past 24 hour(s))  Basic metabolic panel     Status: Abnormal   Collection Time: 05/18/22  9:05 AM  Result Value Ref Range   Sodium 132 (L) 135 - 145 mmol/L   Potassium 4.4 3.5 - 5.1 mmol/L   Chloride 102 98 - 111 mmol/L   CO2 22 22 - 32 mmol/L   Glucose, Bld 91 70 - 99 mg/dL   BUN 11 8 - 23 mg/dL   Creatinine, Ser 0.69 0.44 - 1.00 mg/dL   Calcium 8.8 (L) 8.9 - 10.3 mg/dL   GFR, Estimated >60 >60 mL/min   Anion gap 8 5 - 15  CBC with Differential     Status: Abnormal   Collection Time: 05/18/22  9:05 AM  Result Value Ref Range  WBC 13.0 (H) 4.0 - 10.5 K/uL   RBC 3.49 (L) 3.87 - 5.11 MIL/uL   Hemoglobin 11.6 (L) 12.0 - 15.0 g/dL   HCT 34.1 (L) 36.0 - 46.0 %   MCV 97.7 80.0 - 100.0 fL   MCH 33.2 26.0 - 34.0 pg   MCHC 34.0 30.0 - 36.0 g/dL   RDW 13.7 11.5 - 15.5 %   Platelets 457 (H) 150 - 400 K/uL   nRBC 0.0 0.0 - 0.2 %   Neutrophils Relative % 76 %   Neutro Abs 9.8 (H) 1.7 - 7.7 K/uL   Lymphocytes Relative 14 %   Lymphs Abs 1.8 0.7 - 4.0 K/uL   Monocytes Relative 10 %   Monocytes Absolute 1.2 (H) 0.1 - 1.0 K/uL   Eosinophils Relative 0 %   Eosinophils Absolute 0.0 0.0 - 0.5 K/uL   Basophils Relative 0 %   Basophils Absolute 0.0 0.0 - 0.1 K/uL   Immature Granulocytes 0 %   Abs Immature Granulocytes 0.04 0.00 - 0.07 K/uL   No results found for this or any previous visit (from the past 240 hour(s)).  Renal Function: Recent Labs    05/16/22 1130 05/18/22 0905  CREATININE 0.56 0.69   Estimated Creatinine Clearance: 65.6 mL/min (by  C-G formula based on SCr of 0.69 mg/dL).  Radiologic Imaging: US RENAL  Result Date: 05/18/2022 CLINICAL DATA:  Leakage from nephrostomy tube.  Flank pain. EXAM: RENAL / URINARY TRACT ULTRASOUND COMPLETE COMPARISON:  Current abdomen radiographs. FINDINGS: Right Kidney: Renal measurements: 10.5 x 4.9 x 5.5 cm = volume: 147.4 mL. Normal parenchymal echogenicity. Mild hydronephrosis. No mass or stone. Nephrostomy and ureteral catheters are not defined. Left Kidney: Renal measurements: 10.8 x 5.7 x 5.2 cm = volume: 168.1 mL. Normal parenchymal echogenicity. Moderate hydronephrosis. No mass or stone. Nephrostomy catheter evident in the renal pelvis. Bladder: Bilaterally mildly distended. Wall appears mildly thickened and irregular. Distal aspects of ureteral stents are noted along the posterior bladder. Other: None. IMPRESSION: 1. Mild right and moderate left hydronephrosis. 2. No renal masses or stones. 3. Distal ureteral stents noted in the bladder. Left nephrostomy tube visualized in the left renal pelvis. Electronically Signed   By: Lajean Manes M.D.   On: 05/18/2022 08:52   DG Abdomen 1 View  Result Date: 05/18/2022 CLINICAL DATA:  Status post ureteral and nephrostomy tube placement with copious drainage from the left nephrostomy tube EXAM: ABDOMEN - 1 VIEW COMPARISON:  04/17/2022 FINDINGS: There are bilateral ureteral stents in place. Bilateral percutaneous nephrostomy tubes are also noted. Tube positioning appears optimal. No abnormal abdominal or pelvic calcifications. Bowel gas pattern appears normal. Vascular calcifications noted within the pelvis. IMPRESSION: 1. Bilateral ureteral stents and percutaneous nephrostomy tubes are noted. 2. Normal bowel gas pattern. Electronically Signed   By: Kerby Moors M.D.   On: 05/18/2022 07:42   DG C-Arm 1-60 Min-No Report  Result Date: 05/17/2022 Fluoroscopy was utilized by the requesting physician.  No radiographic interpretation.    I independently reviewed  the above imaging studies.  Impression/Recommendation: R>L Hydronephrosis, Rule Out Ureteral or Bladder Cancer- bilateral neph tubes placed 03/2022 during hospitalization for acute renal failure and possible pyelo. UCX negative. Ct with R>l hydro and qeustionable urothelial thickenig of bladder and R>L ureter. Cytology equivocal. Cr 2's now <1. 50PY smoker, still smokes.  S/p TURBT small, bilateral retrograde pyelogram, bilateral diagnostic ureteroscopy 05/17/2022 who now presents with complaints of urinary leakage around her left side nephrostomy tube.  -KUB and renal ultrasound  05/18/2022 with stents and bilateral nephrostomy tubes in appropriate position and evidence of moderate left-sided hydronephrosis and mild right-sided hydronephrosis. -On examination, she does have evidence of clear yellow urine draining around her left-sided nephrostomy tube in her shirt is soaked in that area.  I uncapped her left-sided nephrostomy tube and connected this to gravity drainage with brisk return of clear the urine.  I briefly on The right side nephrostomy tube and there was slow drainage of urine.  This was then recapped.  I did provide her with a separate bag in case she develops right-sided flank pain, fevers or chills. -We will leave left nephrostomy tube for gravity for now.  Consider repeat clamp trial next week. -Return precautions discussed including flank pain, fevers or chills. -Okay to discharge home from the emergency department.  I will notify Dr. Tresa Moore.  Matt R. Marlyss Cissell MD 05/18/2022, 10:11 AM  [facility]  Pager: [phone]   CC: Charlesetta Shanks, MD

## 2022-05-18 NOTE — ED Provider Notes (Cosign Needed)
[facility] DEPT Provider Note   CSN: [id number] Arrival date & time: 05/18/22  5732     History  Chief Complaint  Patient presents with   Post-op Problem    Betty [name] is a 66 y.o. female.  Betty [name] is a 65 y.o. female with a history of hypertension, CKD, anxiety and depression, with nephrostomy tube placement in July in the setting of acute renal failure, who presents due to leaking from her right nephrostomy tubes.  Patient reports that she had a procedure done yesterday, on chart review it appears that patient had a cystoscopy and ureteroscopy with bilateral stent placement, ureters appeared patent during procedure so the nephrostomy tubes were capped in an effort to allow patient to urinate normally.  Patient reports that in the postop recovery area she had a small amount of leakage from around her left nephrostomy tube but they placed gauze over it and she was discharged home.  Patient reports that leaking got progressively worse throughout the night last night she has been leaking large amounts of clear fluid primarily from around her left nephrostomy tube where it comes out of her back, no redness or purulent drainage.  Has had a small amount of leakage from the right side.  She reports that she has the urge to urinate and has dribbled some urine but has not had a normal volume episode of urination.  She reports some associated abdominal pain and gas, no nausea or vomiting.  No fevers or chills.  No known history of she called the urologist on-call who recommended that she come into the emergency department.  The history is provided by the patient and a relative.       Home Medications Prior to Admission medications   Medication Sig Start Date End Date Taking? Authorizing Provider  acetaminophen (TYLENOL) 500 MG tablet Take 500 mg by mouth every 4 (four) hours as needed (pain).   Yes [provider]  amLODipine (NORVASC) 10 MG tablet  Take 1 tablet (10 mg total) by mouth daily. 04/17/22  Yes Mercy Riding, MD  hydrOXYzine (ATARAX) 25 MG tablet Take 1 tablet (25 mg total) by mouth 3 (three) times daily as needed for anxiety. 04/19/22  Yes Mercy Riding, MD  metoprolol tartrate (LOPRESSOR) 25 MG tablet Take 1 tablet (25 mg total) by mouth 2 (two) times daily. 04/17/22  Yes Mercy Riding, MD  oxyCODONE-acetaminophen (PERCOCET) 5-325 MG tablet Take 1 tablet by mouth every 6 (six) hours as needed for severe pain or moderate pain (post-operatively). 05/17/22 05/17/23 Yes Alexis Frock, MD  nicotine (NICODERM CQ - DOSED IN MG/24 HOURS) 21 mg/24hr patch Place 1 patch (21 mg total) onto the skin daily. Patient not taking: Reported on 05/15/2022 04/17/22   Mercy Riding, MD  polyethylene glycol powder (MIRALAX) 17 GM/SCOOP powder Take 17 g by mouth 2 (two) times daily as needed for mild constipation. Patient not taking: Reported on 05/18/2022 04/17/22   Mercy Riding, MD  senna-docusate (SENOKOT-S) 8.6-50 MG tablet Take 1 tablet by mouth 2 (two) times daily between meals as needed for moderate constipation. Patient not taking: Reported on 05/15/2022 04/17/22   Wendee Beavers T, MD  sodium chloride flush (NS) 0.9 % SOLN Use 5 mLs by intracatheter route every 8 hours. Single use syringes 04/17/22 06/16/22  Mercy Riding, MD  sulfamethoxazole-trimethoprim (BACTRIM DS) 800-160 MG tablet Take 1 tablet by mouth 2 (two) times daily. 05/14/22   [provider]  thiamine (VITAMIN B1) 100 MG tablet Take 1 tablet (100 mg total) by mouth daily. Patient not taking: Reported on 05/15/2022 04/17/22   Mercy Riding, MD      Allergies    Patient has no known allergies.    Review of Systems   Review of Systems  Constitutional:  Negative for chills and fever.  HENT: Negative.    Respiratory:  Negative for cough and shortness of breath.   Cardiovascular:  Negative for chest pain.  Gastrointestinal:  Positive for abdominal pain. Negative for nausea and vomiting.   Genitourinary:  Positive for difficulty urinating and flank pain. Negative for dysuria and enuresis.  Musculoskeletal:  Negative for arthralgias and myalgias.  All other systems reviewed and are negative.   Physical Exam Updated Vital Signs BP (!) 139/99   Pulse 85   Temp 97.9 F (36.6 C) (Oral)   Resp 15   SpO2 100%  Physical Exam Vitals and nursing note reviewed.  Constitutional:      General: She is not in acute distress.    Appearance: Normal appearance. She is well-developed. She is not ill-appearing or diaphoretic.  HENT:     Head: Normocephalic and atraumatic.  Eyes:     General:        Right eye: No discharge.        Left eye: No discharge.  Cardiovascular:     Rate and Rhythm: Normal rate and regular rhythm.     Heart sounds: Normal heart sounds.  Pulmonary:     Effort: Pulmonary effort is normal. No respiratory distress.     Breath sounds: Normal breath sounds.  Abdominal:     General: Bowel sounds are normal. There is no distension.     Palpations: Abdomen is soft. There is no mass.     Tenderness: There is no abdominal tenderness. There is no right CVA tenderness, left CVA tenderness or guarding.     Comments: Abdomen is soft, nontender, nondistended, no guarding or peritoneal signs.  No CVA tenderness, some mild tenderness surrounding the insertion site of each nephrostomy tube but with no surrounding erythema or purulent drainage.  Some clear drainage surrounding the left nephrostomy tube.  Musculoskeletal:        General: No deformity.  Skin:    General: Skin is warm and dry.  Neurological:     Mental Status: She is alert and oriented to person, place, and time.     Coordination: Coordination normal.  Psychiatric:        Mood and Affect: Mood normal.        Behavior: Behavior normal.     ED Results / Procedures / Treatments   Labs (all labs ordered are listed, but only abnormal results are displayed) Labs Reviewed  BASIC METABOLIC PANEL - Abnormal;  Notable for the following components:      Result Value   Sodium 132 (*)    Calcium 8.8 (*)    All other components within normal limits  CBC WITH DIFFERENTIAL/PLATELET - Abnormal; Notable for the following components:   WBC 13.0 (*)    RBC 3.49 (*)    Hemoglobin 11.6 (*)    HCT 34.1 (*)    Platelets 457 (*)    Neutro Abs 9.8 (*)    Monocytes Absolute 1.2 (*)    All other components within normal limits    EKG None  Radiology US RENAL  Result Date: 05/18/2022 CLINICAL DATA:  Leakage from nephrostomy tube.  Flank pain. EXAM: RENAL /  URINARY TRACT ULTRASOUND COMPLETE COMPARISON:  Current abdomen radiographs. FINDINGS: Right Kidney: Renal measurements: 10.5 x 4.9 x 5.5 cm = volume: 147.4 mL. Normal parenchymal echogenicity. Mild hydronephrosis. No mass or stone. Nephrostomy and ureteral catheters are not defined. Left Kidney: Renal measurements: 10.8 x 5.7 x 5.2 cm = volume: 168.1 mL. Normal parenchymal echogenicity. Moderate hydronephrosis. No mass or stone. Nephrostomy catheter evident in the renal pelvis. Bladder: Bilaterally mildly distended. Wall appears mildly thickened and irregular. Distal aspects of ureteral stents are noted along the posterior bladder. Other: None. IMPRESSION: 1. Mild right and moderate left hydronephrosis. 2. No renal masses or stones. 3. Distal ureteral stents noted in the bladder. Left nephrostomy tube visualized in the left renal pelvis. Electronically Signed   By: Lajean Manes M.D.   On: 05/18/2022 08:52   DG Abdomen 1 View  Result Date: 05/18/2022 CLINICAL DATA:  Status post ureteral and nephrostomy tube placement with copious drainage from the left nephrostomy tube EXAM: ABDOMEN - 1 VIEW COMPARISON:  04/17/2022 FINDINGS: There are bilateral ureteral stents in place. Bilateral percutaneous nephrostomy tubes are also noted. Tube positioning appears optimal. No abnormal abdominal or pelvic calcifications. Bowel gas pattern appears normal. Vascular calcifications  noted within the pelvis. IMPRESSION: 1. Bilateral ureteral stents and percutaneous nephrostomy tubes are noted. 2. Normal bowel gas pattern. Electronically Signed   By: Kerby Moors M.D.   On: 05/18/2022 07:42   DG C-Arm 1-60 Min-No Report  Result Date: 05/17/2022 Fluoroscopy was utilized by the requesting physician.  No radiographic interpretation.    Procedures Procedures    Medications Ordered in ED Medications  HYDROmorphone (DILAUDID) injection 0.5 mg (0.5 mg Intravenous Patient Refused/Not Given 05/18/22 1002)  oxyCODONE-acetaminophen (PERCOCET/ROXICET) 5-325 MG per tablet 1 tablet (1 tablet Oral Given 05/18/22 8588)    ED Course/ Medical Decision Making/ A&P                           Medical Decision Making Amount and/or Complexity of Data Reviewed Labs: ordered. Radiology: ordered.  Risk Prescription drug management.   66 y.o. female presents to the ED with complaints of leaking around nephrostomy tube at insertion site, this involves an extensive number of treatment options, and is a complaint that carries with it a high risk of complications and morbidity.  The nephrostomy tube was clamped off and is no longer connected to a bag after her procedure yesterday.  Differential includes displacement of nephrostomy tube, or return of hydronephrosis due to obstruction of the ureter.  On arrival pt is nontoxic, vitals WNL.  Additional history obtained from relative at bedside. Previous records obtained and reviewed including op notes from cystoscopy procedure yesterday  I ordered medication for pain including Percocet   Lab Tests:  I Ordered, reviewed, and interpreted labs, which included: Patient with mild leukocytosis and stable hemoglobin, sodium of 132, fortunately renal function is preserved  Imaging Studies ordered:  I ordered imaging studies which included KUB and renal ultrasound per urology's recommendation, I independently visualized and interpreted imaging which  showed ureteral stents and nephrostomy tubes in appropriate place, ultrasound with hydronephrosis worse on the left than right  ED Course:   I consulted urology upon patient's arrival and they recommended checking renal function as well as KUB and renal ultrasound to assess for placement of tubes and stents and potential hydronephrosis.  Do not feel the patient needed CT at this time.  After results returned I reconsulted Dr. Abner Greenspan with urology  and he reviewed imaging and results.  He came to see and evaluate patient in the ED as well.  Recommends reattaching bag to left nephrostomy tube where patient seems to be having drainage since urine is not draining properly through the ureteral stent.  Recommend holding off on reattaching it back to the right side but giving patient a bag to go home with in case she starts having right flank pain or leakage.  He will reach out to Dr. Tresa Moore for follow-up and I have instructed patient to contact her urologist on Monday as well.  Discussed continued symptomatic care at home and return precautions.  At this time urology recommends discharge home and patient is in agreement with this plan.   Portions of this note were generated with [employer]. Dictation errors may occur despite best attempts at proofreading.         Final Clinical Impression(s) / ED Diagnoses Final diagnoses:  Leakage of nephrostomy catheter, initial encounter ([facility])  Hydronephrosis, unspecified hydronephrosis type    Rx / DC Orders ED Discharge Orders     None             Janet Berlin 05/19/22 1007    Charlesetta Shanks, MD 05/19/22 [id number]

## 2022-05-18 NOTE — ED Triage Notes (Signed)
Ambulatory to ED stating that her L nephrostomy sight is leaking from around sight. States yesterday she had them clamped and tonight has gone through multiple pads.

## 2022-05-18 NOTE — ED Notes (Addendum)
PT has arrived complaining of copious amounts of drainage from sites. RN removed dressings. Sites do not have any outward signs or symptoms of infection. Drainage is not purulent but clear and non odorous.  Pt complains of tenderness and pain at left site. Pt reports she has not been taking her prescribed pain meds and is exhausted. She appears anxious.  RN provided teaching regarding importance of staying on top of pain and that being a contributing factor to her exhaustion. Pt's brother is in town and RN encouraged her to allow him to help her. She agreed she would need to do this moving forward.  Pt does report she is urinating a very small amount.

## 2022-05-18 NOTE — Discharge Instructions (Addendum)
We have reattached the bag to your left nephrostomy tube to help the left kidney drain, the right kidney seems to be draining better but if you start having drainage from the right side or increasing pain you can attach the bag to the right nephrostomy tube as well.  If you start having worsening pain, vomiting or fevers or other new or worsening symptoms over the weekend call the on-call urologist or return to the ED.  Call Dr. Tresa Moore on Monday morning to schedule close follow-up appointment.

## 2022-05-23 LAB — SURGICAL PATHOLOGY

## 2022-05-31 ENCOUNTER — Other Ambulatory Visit (HOSPITAL_COMMUNITY): Payer: Self-pay | Admitting: Student

## 2022-05-31 ENCOUNTER — Other Ambulatory Visit (HOSPITAL_COMMUNITY): Payer: Self-pay | Admitting: Interventional Radiology

## 2022-05-31 ENCOUNTER — Ambulatory Visit (HOSPITAL_COMMUNITY)
Admission: RE | Admit: 2022-05-31 | Discharge: 2022-05-31 | Disposition: A | Discharge status: Home / Self Care | Payer: Medicare Other | Source: Ambulatory Visit | Attending: Student | Admitting: Student

## 2022-05-31 DIAGNOSIS — Z436 Encounter for attention to other artificial openings of urinary tract: Secondary | ICD-10-CM | POA: Diagnosis present

## 2022-05-31 DIAGNOSIS — N1330 Unspecified hydronephrosis: Secondary | ICD-10-CM

## 2022-05-31 HISTORY — PX: IR NEPHROSTOMY EXCHANGE LEFT: IMG6069

## 2022-05-31 HISTORY — PX: IR NEPHROSTOMY EXCHANGE RIGHT: IMG6070

## 2022-05-31 MED ORDER — LIDOCAINE HCL 1 % IJ SOLN
INTRAMUSCULAR | Status: AC
  Filled 2022-05-31: qty 20

## 2022-05-31 MED ORDER — IOHEXOL 300 MG/ML  SOLN
50.0000 mL | Freq: Once | INTRAMUSCULAR | Status: AC | PRN
  Administered 2022-05-31: 15 mL

## 2022-05-31 NOTE — Procedures (Signed)
Interventional Radiology Procedure Note  Procedure: Bilateral PCN exchange  Indication: Bilateral obstructive uropathy  Findings: Please refer to procedural dictation for full description.  Complications: None  EBL: < 10 mL  Miachel Roux, MD [phone]

## 2022-06-03 ENCOUNTER — Telehealth: Payer: Self-pay | Admitting: Oncology

## 2022-06-03 NOTE — Telephone Encounter (Signed)
Scheduled appt per 9/18 referral. Pt is aware of appt date and time. Pt is aware to arrive 15 mins prior to appt time and to bring and updated insurance card. Pt is aware of appt location.

## 2022-06-12 ENCOUNTER — Inpatient Hospital Stay: Payer: Medicare Other | Attending: Oncology | Admitting: Oncology

## 2022-06-12 ENCOUNTER — Other Ambulatory Visit: Payer: Self-pay

## 2022-06-12 ENCOUNTER — Telehealth: Payer: Self-pay | Admitting: *Deleted

## 2022-06-12 VITALS — BP 114/71 | HR 77 | Temp 98.0°F | Resp 15 | Ht 66.0 in | Wt 107.2 lb

## 2022-06-12 DIAGNOSIS — R54 Age-related physical debility: Secondary | ICD-10-CM | POA: Diagnosis not present

## 2022-06-12 DIAGNOSIS — N189 Chronic kidney disease, unspecified: Secondary | ICD-10-CM

## 2022-06-12 DIAGNOSIS — F17210 Nicotine dependence, cigarettes, uncomplicated: Secondary | ICD-10-CM | POA: Insufficient documentation

## 2022-06-12 DIAGNOSIS — F419 Anxiety disorder, unspecified: Secondary | ICD-10-CM | POA: Insufficient documentation

## 2022-06-12 DIAGNOSIS — Z79899 Other long term (current) drug therapy: Secondary | ICD-10-CM | POA: Diagnosis not present

## 2022-06-12 DIAGNOSIS — K219 Gastro-esophageal reflux disease without esophagitis: Secondary | ICD-10-CM | POA: Diagnosis not present

## 2022-06-12 DIAGNOSIS — C678 Malignant neoplasm of overlapping sites of bladder: Secondary | ICD-10-CM

## 2022-06-12 DIAGNOSIS — C679 Malignant neoplasm of bladder, unspecified: Secondary | ICD-10-CM | POA: Diagnosis present

## 2022-06-12 DIAGNOSIS — I129 Hypertensive chronic kidney disease with stage 1 through stage 4 chronic kidney disease, or unspecified chronic kidney disease: Secondary | ICD-10-CM | POA: Diagnosis not present

## 2022-06-12 NOTE — Telephone Encounter (Signed)
Copy of today's MD note faxed to Dr Reesa Chew with [facility] per patient request, fax confirmation received.

## 2022-06-12 NOTE — Progress Notes (Signed)
Reason for the request:    Bladder cancer  HPI: I was asked by Dr. Tresa Moore to evaluate Betty [name] with a diagnosis of bladder cancer.  She is a 66 year old woman with history of hypertension, GERD and tobacco use who presented with flank pain and acute kidney injury in August 2023.  CT scan of the abdomen pelvis was bilateral hydronephrosis and circumstantial bladder thickening that is suggestive for malignancy.  She underwent right percutaneous nephrostomy tube placement with improvement in her renal function.  She also had subsequently left percutaneous nephrostomy tube on July 31.  She underwent cystoscopy on May 17, 2022 under the care of Dr. Tresa Moore and found to have a bladder mass and a biopsy obtained at that time.  Bilateral ureteral stents were placed and transurethral resection of the tumor was completed.  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  CT scan of the abdomen and pelvis with contrast obtained on April 06, 2022 at the time of presentation did not show any evidence of metastatic disease including lymphadenopathy.  Repeat CT scan without the contrast obtained on July 30 did not show any additional information.  Clinically, she reports few complaints and she feels quite debilitated.  She lives independently and ambulating although with some difficulties.  She has lost close to 30 pounds and her appetite and sleep is poor.  She denies any hematuria or dysuria.  She does not report any headaches, blurry vision, syncope or seizures. Does not report any fevers, chills or sweats.  Does not report any cough, wheezing or hemoptysis.  Does not report any chest pain, palpitation, orthopnea or leg edema.  Does not report any nausea, vomiting or abdominal pain.  Does not report any constipation or diarrhea.  Does not report any skeletal complaints.    Does not report frequency, urgency or hematuria.  Does not report any skin rashes  or lesions. Does not report any heat or cold intolerance.  Does not report any lymphadenopathy or petechiae.  Does not report any anxiety or depression.  Remaining review of systems is negative.     Past Medical History:  Diagnosis Date   Anxiety    Chronic kidney disease    Depression    GERD (gastroesophageal reflux disease)    HTN (hypertension)   :   Past Surgical History:  Procedure Laterality Date   APPENDECTOMY     CYSTOSCOPY/RETROGRADE/URETEROSCOPY Bilateral 05/17/2022   Procedure: CYSTOSCOPY/ BILATERAL RETROGRADE/BILATERAL DIAGNOSTIC URETEROSCOPY AND STENT PLACEMENT;  Surgeon: Alexis Frock, MD;  Location: [facility];  Service: Urology;  Laterality: Bilateral;  75 MINS   IR NEPHROSTOMY EXCHANGE LEFT  05/31/2022   IR NEPHROSTOMY EXCHANGE RIGHT  05/31/2022   IR NEPHROSTOMY PLACEMENT LEFT  04/15/2022   IR NEPHROSTOMY PLACEMENT RIGHT  04/12/2022   IR RADIOLOGY PERIPHERAL GUIDED IV START  04/15/2022   IR US GUIDE VASC ACCESS RIGHT  04/15/2022   TRANSURETHRAL RESECTION OF BLADDER TUMOR N/A 05/17/2022   Procedure: TRANSURETHRAL RESECTION OF BLADDER TUMOR (TURBT);  Surgeon: Alexis Frock, MD;  Location: [facility];  Service: Urology;  Laterality: N/A;  :   Current Outpatient Medications:    acetaminophen (TYLENOL) 500 MG tablet, Take 500 mg by mouth every 4 (four) hours as needed (pain)., Disp: , Rfl:    amLODipine (NORVASC) 10 MG tablet, Take 1 tablet (10 mg total) by mouth daily., Disp: 30 tablet, Rfl: 1   hydrOXYzine (ATARAX) 25 MG tablet, Take 1 tablet (25 mg total) by mouth 3 (  three) times daily as needed for anxiety., Disp: 90 tablet, Rfl: 0   metoprolol tartrate (LOPRESSOR) 25 MG tablet, Take 1 tablet (25 mg total) by mouth 2 (two) times daily., Disp: 60 tablet, Rfl: 1   nicotine (NICODERM CQ - DOSED IN MG/24 HOURS) 21 mg/24hr patch, Place 1 patch (21 mg total) onto the skin daily. (Patient not taking: Reported on 05/15/2022), Disp: 28 patch, Rfl: 0   oxyCODONE-acetaminophen  (PERCOCET) 5-325 MG tablet, Take 1 tablet by mouth every 6 (six) hours as needed for severe pain or moderate pain (post-operatively)., Disp: 15 tablet, Rfl: 0   polyethylene glycol powder (MIRALAX) 17 GM/SCOOP powder, Take 17 g by mouth 2 (two) times daily as needed for mild constipation. (Patient not taking: Reported on 05/18/2022), Disp: 255 g, Rfl: 1   senna-docusate (SENOKOT-S) 8.6-50 MG tablet, Take 1 tablet by mouth 2 (two) times daily between meals as needed for moderate constipation. (Patient not taking: Reported on 05/15/2022), Disp: , Rfl: 0   sodium chloride flush (NS) 0.9 % SOLN, Use 5 mLs by intracatheter route every 8 hours. Single use syringes, Disp: 1800 mL, Rfl: 1   sulfamethoxazole-trimethoprim (BACTRIM DS) 800-160 MG tablet, Take 1 tablet by mouth 2 (two) times daily., Disp: , Rfl:    thiamine (VITAMIN B1) 100 MG tablet, Take 1 tablet (100 mg total) by mouth daily. (Patient not taking: Reported on 05/15/2022), Disp: 30 tablet, Rfl: 1:  No Known Allergies:  No family history on file.:   Social History   Socioeconomic History   Marital status: Divorced    Spouse name: Not on file   Number of children: Not on file   Years of education: Not on file   Highest education level: Not on file  Occupational History   Not on file  Tobacco Use   Smoking status: Every Day    Packs/day: 1.00    Years: 50.00    Total pack years: 50.00    Types: Cigarettes   Smokeless tobacco: Not on file  Vaping Use   Vaping Use: Never used  Substance and Sexual Activity   Alcohol use: Yes    Comment: 2-3 times a week   Drug use: Not Currently   Sexual activity: Not on file  Other Topics Concern   Not on file  Social History Narrative   Not on file   Social Determinants of Health   Financial Resource Strain: Not on file  Food Insecurity: Not on file  Transportation Needs: Not on file  Physical Activity: Not on file  Stress: Not on file  Social Connections: Not on file  Intimate Partner  Violence: Not on file  :  Pertinent items are noted in HPI.  Exam: Blood pressure 114/71, pulse 77, temperature 98 F (36.7 C), temperature source Temporal, resp. rate 15, height '5\' 6"'$  (1.676 m), weight 107 lb 3.2 oz (48.6 kg), SpO2 95 %.  General appearance: alert and cooperative appeared without distress. Head: atraumatic without any abnormalities. Eyes: conjunctivae/corneas clear. PERRL.  Sclera anicteric. Throat: lips, mucosa, and tongue normal; without oral thrush or ulcers. Resp: clear to auscultation bilaterally without rhonchi, wheezes or dullness to percussion. Cardio: regular rate and rhythm, S1, S2 normal, no murmur, click, rub or gallop GI: soft, non-tender; bowel sounds normal; no masses,  no organomegaly Skin: Skin color, texture, turgor normal. No rashes or lesions Lymph nodes: Cervical, supraclavicular, and axillary nodes normal. Neurologic: Grossly normal without any motor, sensory or deep tendon reflexes. Musculoskeletal: No joint deformity or effusion.  IR NEPHROSTOMY EXCHANGE LEFT  Result Date: 05/31/2022 INDICATION: 66 year old woman with history of obstructive uropathy treated by bilateral nephrostomy drain placement. The drains were capped, however recently she has had leakage around the right nephrostomy drain and increasing left flank pain. Interventional radiology consulted by urology to exchange the drains and attached to bag. EXAM: Bilateral percutaneous nephrostomy drain exchange COMPARISON:  04/15/2022 MEDICATIONS: None ANESTHESIA/SEDATION: None CONTRAST:  15 mL of Omnipaque 300-administered into the collecting system(s) FLUOROSCOPY TIME:  Radiation Exposure Index (as provided by the fluoroscopic device): 4 mGy Kerma COMPLICATIONS: None immediate. PROCEDURE: Informed written consent was obtained from the patient after a thorough discussion of the procedural risks, benefits and alternatives. All questions were addressed. Maximal Sterile Barrier Technique was  utilized including caps, mask, sterile gowns, sterile gloves, sterile drape, hand hygiene and skin antiseptic. A timeout was performed prior to the initiation of the procedure. Patient position prone on the procedure table. The external segment of both drains and surrounding skin prepped and draped in sterile fashion. Contrast administered through the left nephrostomy drain under fluoroscopy confirmed appropriate positioning within the renal collecting system. Subcutaneous lidocaine was administered along the catheter tract. The drain was cut and removed over 0.035 inch guidewire and replaced with identical 10.2 [place] multipurpose pigtail drain. Contrast administered through the new drain confirmed appropriate positioning within the renal collecting system. Contrast administered through the right nephrostomy drain under fluoroscopy confirmed appropriate positioning within the renal collecting system. Subcutaneous lidocaine was administered along the catheter tract. The drain was cut and removed over 0.035 inch guidewire and replaced with an identical 10.2 [place] multipurpose pigtail catheter. Contrast administered through the new drain confirmed appropriate positioning within the renal collecting system. Both drains were secured to skin with silk suture and connected to drainage bags. IMPRESSION: Bilateral percutaneous nephrostomy drain exchange with new 10.2 [place] multipurpose pigtail drains in place. Both drains were attached to bags. PLAN: Return in 12 weeks for routine exchange. Electronically Signed   By: Miachel Roux M.D.   On: 05/31/2022 15:46   IR NEPHROSTOMY EXCHANGE RIGHT  Result Date: 05/31/2022 INDICATION: 66 year old woman with history of obstructive uropathy treated by bilateral nephrostomy drain placement. The drains were capped, however recently she has had leakage around the right nephrostomy drain and increasing left flank pain. Interventional radiology consulted by urology to exchange the  drains and attached to bag. EXAM: Bilateral percutaneous nephrostomy drain exchange COMPARISON:  04/15/2022 MEDICATIONS: None ANESTHESIA/SEDATION: None CONTRAST:  15 mL of Omnipaque 300-administered into the collecting system(s) FLUOROSCOPY TIME:  Radiation Exposure Index (as provided by the fluoroscopic device): 4 mGy Kerma COMPLICATIONS: None immediate. PROCEDURE: Informed written consent was obtained from the patient after a thorough discussion of the procedural risks, benefits and alternatives. All questions were addressed. Maximal Sterile Barrier Technique was utilized including caps, mask, sterile gowns, sterile gloves, sterile drape, hand hygiene and skin antiseptic. A timeout was performed prior to the initiation of the procedure. Patient position prone on the procedure table. The external segment of both drains and surrounding skin prepped and draped in sterile fashion. Contrast administered through the left nephrostomy drain under fluoroscopy confirmed appropriate positioning within the renal collecting system. Subcutaneous lidocaine was administered along the catheter tract. The drain was cut and removed over 0.035 inch guidewire and replaced with identical 10.2 [place] multipurpose pigtail drain. Contrast administered through the new drain confirmed appropriate positioning within the renal collecting system. Contrast administered through the right nephrostomy drain under fluoroscopy confirmed appropriate positioning within the renal collecting  system. Subcutaneous lidocaine was administered along the catheter tract. The drain was cut and removed over 0.035 inch guidewire and replaced with an identical 10.2 [place] multipurpose pigtail catheter. Contrast administered through the new drain confirmed appropriate positioning within the renal collecting system. Both drains were secured to skin with silk suture and connected to drainage bags. IMPRESSION: Bilateral percutaneous nephrostomy drain exchange with new  10.2 [place] multipurpose pigtail drains in place. Both drains were attached to bags. PLAN: Return in 12 weeks for routine exchange. Electronically Signed   By: Miachel Roux M.D.   On: 05/31/2022 15:46   US RENAL  Result Date: 05/18/2022 CLINICAL DATA:  Leakage from nephrostomy tube.  Flank pain. EXAM: RENAL / URINARY TRACT ULTRASOUND COMPLETE COMPARISON:  Current abdomen radiographs. FINDINGS: Right Kidney: Renal measurements: 10.5 x 4.9 x 5.5 cm = volume: 147.4 mL. Normal parenchymal echogenicity. Mild hydronephrosis. No mass or stone. Nephrostomy and ureteral catheters are not defined. Left Kidney: Renal measurements: 10.8 x 5.7 x 5.2 cm = volume: 168.1 mL. Normal parenchymal echogenicity. Moderate hydronephrosis. No mass or stone. Nephrostomy catheter evident in the renal pelvis. Bladder: Bilaterally mildly distended. Wall appears mildly thickened and irregular. Distal aspects of ureteral stents are noted along the posterior bladder. Other: None. IMPRESSION: 1. Mild right and moderate left hydronephrosis. 2. No renal masses or stones. 3. Distal ureteral stents noted in the bladder. Left nephrostomy tube visualized in the left renal pelvis. Electronically Signed   By: Lajean Manes M.D.   On: 05/18/2022 08:52   DG Abdomen 1 View  Result Date: 05/18/2022 CLINICAL DATA:  Status post ureteral and nephrostomy tube placement with copious drainage from the left nephrostomy tube EXAM: ABDOMEN - 1 VIEW COMPARISON:  04/17/2022 FINDINGS: There are bilateral ureteral stents in place. Bilateral percutaneous nephrostomy tubes are also noted. Tube positioning appears optimal. No abnormal abdominal or pelvic calcifications. Bowel gas pattern appears normal. Vascular calcifications noted within the pelvis. IMPRESSION: 1. Bilateral ureteral stents and percutaneous nephrostomy tubes are noted. 2. Normal bowel gas pattern. Electronically Signed   By: Kerby Moors M.D.   On: 05/18/2022 07:42   DG C-Arm 1-60 Min-No  Report  Result Date: 05/17/2022 Fluoroscopy was utilized by the requesting physician.  No radiographic interpretation.    Assessment and Plan:   66 year old woman with:  1.  Bladder cancer presented with flank pain and acute kidney injury associated with bilateral hydronephrosis in August 2023.  She underwent a TURBT and a bilateral nephrostomy tube placement.  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  Treatment options at this time were discussed including the primary surgical therapy after neoadjuvant chemotherapy.  Despite the variant histology which is very aggressive with high tendency to metastasize, I believe that upfront systemic chemotherapy is reasonable.  Obtaining a PET scan is recommended to complete her staging prior to proceeding with chemotherapy.  Although it will not immediately affect the treatment choices but likely can change the treatment goal if she has wide metastatic disease.   The logistics and rationale for using chemotherapy was reviewed today in detail.  Complication associated with cisplatin and gemcitabine chemotherapy was discussed.  These complications include nausea, vomiting, myelosuppression, fatigue, infusion related complications, renal insufficiency, neutropenia, neutropenic sepsis and rarely serious thrombosis, hospitalization and death.  The benefit would also if he has an excellent response to chemotherapy, curative surgical resection may be attempted.  The plan is to treat with gemcitabine and  cisplatin on day 1, gemcitabine day 8 out of a 21-day cycle.  Anticipate needing 4 to 6 cycles of therapy    After discussion today, she is agreeable to proceed with a PET scan before considering chemotherapy.  If she has widespread disease metastasis she might opt to not to proceed with chemotherapy.     2.  IV access: Risks and benefits of using  Port-A-Cath versus peripheral veins was discussed today.  Complication associated with Port-A-Cath insertion include bleeding, infection and thrombosis.  This will be considered if she opted for chemotherapy.   3.  Antiemetics: Prescription for Compazine was made available to her if she decides to proceed with chemotherapy.   4.  Renal function surveillance: We will continue to monitor on cisplatin therapy.  Her creatinine has normalized after nephrostomy tube placement.   5.  Goals of care and prognosis: This was discussed today in detail.  She is quite debilitated and frail and the likelihood of curing this cancer even with aggressive measures remains low.  She is not sure she wants to proceed with aggressive measures but would like to get the PET scan and think about that before making a decision.   6.  Follow-up: will be in the immediate future after PET scan to discuss next steps.   60  minutes were dedicated to this visit. The time was spent on reviewing laboratory data, imaging studies, discussing treatment options, reviewing pathology results and answering questions regarding future plan.     A copy of this consult has been forwarded to the requesting physician.

## 2022-06-17 ENCOUNTER — Telehealth: Payer: Self-pay | Admitting: Oncology

## 2022-06-17 NOTE — Telephone Encounter (Signed)
Scheduled per 10/27 los, patient has been called and notified of upcoming appointments.  

## 2022-06-19 ENCOUNTER — Ambulatory Visit (HOSPITAL_COMMUNITY)
Admission: RE | Admit: 2022-06-19 | Discharge: 2022-06-19 | Disposition: A | Discharge status: Home / Self Care | Payer: Medicare Other | Source: Ambulatory Visit | Attending: Oncology | Admitting: Oncology

## 2022-06-19 DIAGNOSIS — C679 Malignant neoplasm of bladder, unspecified: Secondary | ICD-10-CM | POA: Diagnosis present

## 2022-06-19 LAB — GLUCOSE, CAPILLARY: Glucose-Capillary: 96 mg/dL (ref 70–99)

## 2022-06-19 MED ORDER — FLUDEOXYGLUCOSE F - 18 (FDG) INJECTION
5.3000 | Freq: Once | INTRAVENOUS | Status: AC | PRN
  Administered 2022-06-19: 5.19 via INTRAVENOUS

## 2022-07-01 ENCOUNTER — Inpatient Hospital Stay: Payer: Medicare Other | Attending: Oncology | Admitting: Oncology

## 2022-07-01 NOTE — Progress Notes (Signed)
    I attempted multiple times to reach the patient without success.  I was able to speak with her brother who informed me that the patient suddenly passed away at home.  The cause is unclear at this time.  I shared with him recent PET scan which offers no clear-cut explanation for her passing. This encounter was created in error - please disregard. This encounter was created in error - please disregard.

## 2022-07-17 DEATH — deceased

## 2022-08-23 ENCOUNTER — Inpatient Hospital Stay (HOSPITAL_COMMUNITY): Admission: RE | Admit: 2022-08-23 | Payer: Medicare Other | Source: Ambulatory Visit
